# Patient Record
Sex: Female | Born: 1970 | Race: Black or African American | Hispanic: No | Smoking: Never smoker
Health system: Southern US, Community
[De-identification: ages and names within clinical notes are randomized; demographics above are authoritative.]

## PROBLEM LIST (undated history)

## (undated) DIAGNOSIS — I10 Essential (primary) hypertension: Secondary | ICD-10-CM

## (undated) HISTORY — PX: WISDOM TOOTH EXTRACTION: SHX21

## (undated) HISTORY — PX: TUBAL LIGATION: SHX77

---

## 1999-03-26 ENCOUNTER — Emergency Department (HOSPITAL_COMMUNITY): Admission: EM | Admit: 1999-03-26 | Discharge: 1999-03-26 | Payer: Self-pay

## 1999-03-26 ENCOUNTER — Encounter: Payer: Self-pay | Admitting: Emergency Medicine

## 1999-04-14 ENCOUNTER — Emergency Department (HOSPITAL_COMMUNITY): Admission: EM | Admit: 1999-04-14 | Discharge: 1999-04-14 | Payer: Self-pay | Admitting: *Deleted

## 1999-08-19 ENCOUNTER — Emergency Department (HOSPITAL_COMMUNITY): Admission: EM | Admit: 1999-08-19 | Discharge: 1999-08-19 | Payer: Self-pay | Admitting: Emergency Medicine

## 1999-10-22 ENCOUNTER — Emergency Department (HOSPITAL_COMMUNITY): Admission: EM | Admit: 1999-10-22 | Discharge: 1999-10-22 | Payer: Self-pay | Admitting: Emergency Medicine

## 2000-03-17 ENCOUNTER — Emergency Department (HOSPITAL_COMMUNITY): Admission: EM | Admit: 2000-03-17 | Discharge: 2000-03-17 | Payer: Self-pay | Admitting: Emergency Medicine

## 2000-03-18 ENCOUNTER — Emergency Department (HOSPITAL_COMMUNITY): Admission: EM | Admit: 2000-03-18 | Discharge: 2000-03-18 | Payer: Self-pay

## 2000-07-19 ENCOUNTER — Emergency Department (HOSPITAL_COMMUNITY): Admission: EM | Admit: 2000-07-19 | Discharge: 2000-07-19 | Payer: Self-pay | Admitting: Emergency Medicine

## 2001-02-24 ENCOUNTER — Emergency Department (HOSPITAL_COMMUNITY): Admission: EM | Admit: 2001-02-24 | Discharge: 2001-02-24 | Payer: Self-pay | Admitting: Emergency Medicine

## 2001-02-24 ENCOUNTER — Encounter: Payer: Self-pay | Admitting: Emergency Medicine

## 2001-02-25 ENCOUNTER — Inpatient Hospital Stay (HOSPITAL_COMMUNITY): Admission: AD | Admit: 2001-02-25 | Discharge: 2001-02-25 | Payer: Self-pay | Admitting: Obstetrics

## 2001-05-24 ENCOUNTER — Emergency Department (HOSPITAL_COMMUNITY): Admission: EM | Admit: 2001-05-24 | Discharge: 2001-05-24 | Payer: Self-pay | Admitting: *Deleted

## 2002-01-13 ENCOUNTER — Emergency Department (HOSPITAL_COMMUNITY): Admission: EM | Admit: 2002-01-13 | Discharge: 2002-01-13 | Payer: Self-pay | Admitting: Emergency Medicine

## 2003-02-25 ENCOUNTER — Emergency Department (HOSPITAL_COMMUNITY): Admission: EM | Admit: 2003-02-25 | Discharge: 2003-02-25 | Payer: Self-pay | Admitting: Emergency Medicine

## 2003-02-27 ENCOUNTER — Ambulatory Visit (HOSPITAL_COMMUNITY): Admission: RE | Admit: 2003-02-27 | Discharge: 2003-02-27 | Payer: Self-pay

## 2004-04-07 ENCOUNTER — Emergency Department (HOSPITAL_COMMUNITY): Admission: EM | Admit: 2004-04-07 | Discharge: 2004-04-07 | Payer: Self-pay | Admitting: Emergency Medicine

## 2005-10-21 ENCOUNTER — Emergency Department (HOSPITAL_COMMUNITY): Admission: EM | Admit: 2005-10-21 | Discharge: 2005-10-21 | Payer: Self-pay | Admitting: Emergency Medicine

## 2006-02-28 ENCOUNTER — Emergency Department (HOSPITAL_COMMUNITY): Admission: EM | Admit: 2006-02-28 | Discharge: 2006-02-28 | Payer: Self-pay | Admitting: Emergency Medicine

## 2007-07-11 ENCOUNTER — Emergency Department (HOSPITAL_COMMUNITY): Admission: EM | Admit: 2007-07-11 | Discharge: 2007-07-11 | Payer: Self-pay | Admitting: Emergency Medicine

## 2009-05-19 ENCOUNTER — Emergency Department (HOSPITAL_COMMUNITY): Admission: EM | Admit: 2009-05-19 | Discharge: 2009-05-19 | Payer: Self-pay | Admitting: Emergency Medicine

## 2009-10-03 ENCOUNTER — Emergency Department (HOSPITAL_COMMUNITY): Admission: EM | Admit: 2009-10-03 | Discharge: 2009-10-03 | Payer: Self-pay | Admitting: Emergency Medicine

## 2010-05-16 ENCOUNTER — Emergency Department (HOSPITAL_COMMUNITY)
Admission: EM | Admit: 2010-05-16 | Discharge: 2010-05-16 | Payer: Self-pay | Source: Home / Self Care | Admitting: Family Medicine

## 2010-05-22 ENCOUNTER — Emergency Department (HOSPITAL_COMMUNITY)
Admission: EM | Admit: 2010-05-22 | Discharge: 2010-05-22 | Payer: Self-pay | Source: Home / Self Care | Admitting: Emergency Medicine

## 2010-08-16 LAB — URINALYSIS, ROUTINE W REFLEX MICROSCOPIC
Bilirubin Urine: NEGATIVE
Ketones, ur: NEGATIVE mg/dL
Nitrite: NEGATIVE
Protein, ur: NEGATIVE mg/dL
Urobilinogen, UA: 1 mg/dL (ref 0.0–1.0)

## 2010-08-16 LAB — POCT I-STAT, CHEM 8
BUN: 11 mg/dL (ref 6–23)
Creatinine, Ser: 0.9 mg/dL (ref 0.4–1.2)
Potassium: 3.8 mEq/L (ref 3.5–5.1)
Sodium: 140 mEq/L (ref 135–145)

## 2010-09-07 LAB — DIFFERENTIAL
Basophils Absolute: 0 10*3/uL (ref 0.0–0.1)
Eosinophils Relative: 1 % (ref 0–5)
Lymphocytes Relative: 53 % — ABNORMAL HIGH (ref 12–46)
Lymphs Abs: 3.1 10*3/uL (ref 0.7–4.0)
Neutro Abs: 2.3 10*3/uL (ref 1.7–7.7)
Neutrophils Relative %: 40 % — ABNORMAL LOW (ref 43–77)

## 2010-09-07 LAB — COMPREHENSIVE METABOLIC PANEL
ALT: 25 U/L (ref 0–35)
AST: 25 U/L (ref 0–37)
Albumin: 4 g/dL (ref 3.5–5.2)
CO2: 28 mEq/L (ref 19–32)
Calcium: 9.1 mg/dL (ref 8.4–10.5)
Chloride: 102 mEq/L (ref 96–112)
GFR calc Af Amer: >60 mL/min (ref >60)
GFR calc non Af Amer: >60 mL/min (ref >60)
Sodium: 137 mEq/L (ref 135–145)

## 2010-09-07 LAB — POCT CARDIAC MARKERS
CKMB, poc: 1.1 ng/mL (ref 1.0–8.0)
CKMB, poc: 1.3 ng/mL (ref 1.0–8.0)
Myoglobin, poc: 90.2 ng/mL (ref 12–200)
Troponin i, poc: <0.05 ng/mL (ref 0.00–0.09)
Troponin i, poc: <0.05 ng/mL (ref 0.00–0.09)

## 2010-09-07 LAB — CBC
HCT: 38.6 % (ref 36.0–46.0)
Platelets: 268 10*3/uL (ref 150–400)
WBC: 5.9 10*3/uL (ref 4.0–10.5)

## 2011-02-25 LAB — DIFFERENTIAL
Eosinophils Absolute: 0
Lymphs Abs: 2.6
Monocytes Relative: 5
Neutro Abs: 5.5
Neutrophils Relative %: 64

## 2011-02-25 LAB — POCT CARDIAC MARKERS
CKMB, poc: 1.2
Myoglobin, poc: 109
Operator id: 4001

## 2011-02-25 LAB — BASIC METABOLIC PANEL
BUN: 7
Calcium: 9.4
Creatinine, Ser: 0.91
GFR calc Af Amer: >60
GFR calc non Af Amer: >60

## 2011-02-25 LAB — CBC
Platelets: 308
RBC: 4.4
WBC: 8.6

## 2011-11-17 ENCOUNTER — Emergency Department (HOSPITAL_COMMUNITY): Payer: Self-pay

## 2011-11-17 ENCOUNTER — Encounter (HOSPITAL_COMMUNITY): Payer: Self-pay | Admitting: Emergency Medicine

## 2011-11-17 ENCOUNTER — Emergency Department (HOSPITAL_COMMUNITY)
Admission: EM | Admit: 2011-11-17 | Discharge: 2011-11-17 | Disposition: A | Discharge status: Home / Self Care | Payer: Self-pay | Attending: Emergency Medicine | Admitting: Emergency Medicine

## 2011-11-17 DIAGNOSIS — R42 Dizziness and giddiness: Secondary | ICD-10-CM | POA: Insufficient documentation

## 2011-11-17 DIAGNOSIS — H539 Unspecified visual disturbance: Secondary | ICD-10-CM | POA: Insufficient documentation

## 2011-11-17 DIAGNOSIS — M542 Cervicalgia: Secondary | ICD-10-CM | POA: Insufficient documentation

## 2011-11-17 DIAGNOSIS — R079 Chest pain, unspecified: Secondary | ICD-10-CM | POA: Insufficient documentation

## 2011-11-17 DIAGNOSIS — I160 Hypertensive urgency: Secondary | ICD-10-CM

## 2011-11-17 DIAGNOSIS — I10 Essential (primary) hypertension: Secondary | ICD-10-CM | POA: Insufficient documentation

## 2011-11-17 HISTORY — DX: Essential (primary) hypertension: I10

## 2011-11-17 LAB — BASIC METABOLIC PANEL
Calcium: 9.3 mg/dL (ref 8.4–10.5)
GFR calc Af Amer: 89 mL/min — ABNORMAL LOW (ref >90)
GFR calc non Af Amer: 77 mL/min — ABNORMAL LOW (ref >90)
Glucose, Bld: 89 mg/dL (ref 70–99)
Potassium: 3.7 mEq/L (ref 3.5–5.1)
Sodium: 138 mEq/L (ref 135–145)

## 2011-11-17 LAB — CBC
MCH: 29.7 pg (ref 26.0–34.0)
MCV: 87.1 fL (ref 78.0–100.0)
Platelets: 304 10*3/uL (ref 150–400)
RBC: 4.51 MIL/uL (ref 3.87–5.11)
RDW: 12.9 % (ref 11.5–15.5)
WBC: 5.9 10*3/uL (ref 4.0–10.5)

## 2011-11-17 LAB — HEPATIC FUNCTION PANEL
AST: 21 U/L (ref 0–37)
Albumin: 3.9 g/dL (ref 3.5–5.2)
Alkaline Phosphatase: 65 U/L (ref 39–117)
Total Bilirubin: 0.5 mg/dL (ref 0.3–1.2)

## 2011-11-17 LAB — DIFFERENTIAL
Basophils Absolute: 0 10*3/uL (ref 0.0–0.1)
Eosinophils Absolute: 0.1 10*3/uL (ref 0.0–0.7)
Eosinophils Relative: 1 % (ref 0–5)
Lymphs Abs: 2.6 10*3/uL (ref 0.7–4.0)
Neutrophils Relative %: 51 % (ref 43–77)

## 2011-11-17 MED ORDER — METOPROLOL TARTRATE 50 MG PO TABS: 50.0000 mg | ORAL_TABLET | Freq: Two times a day (BID) | ORAL | Status: DC

## 2011-11-17 MED ORDER — HYDROCHLOROTHIAZIDE 25 MG PO TABS: 25.0000 mg | ORAL_TABLET | Freq: Every day | ORAL | Status: DC

## 2011-11-17 MED ORDER — PROMETHAZINE HCL 25 MG PO TABS: 25.0000 mg | ORAL_TABLET | Freq: Four times a day (QID) | ORAL | Status: DC | PRN

## 2011-11-17 MED ORDER — SODIUM CHLORIDE 0.9 % IV SOLN
INTRAVENOUS | Status: DC
  Administered 2011-11-17: 16:00:00 via INTRAVENOUS

## 2011-11-17 MED ORDER — HYDROCODONE-ACETAMINOPHEN 5-325 MG PO TABS: 1.0000 | ORAL_TABLET | ORAL | Status: AC | PRN

## 2011-11-17 MED ORDER — SODIUM CHLORIDE 0.9 % IV SOLN: INTRAVENOUS | Status: DC

## 2011-11-17 MED ORDER — LABETALOL HCL 5 MG/ML IV SOLN
20.0000 mg | Freq: Once | INTRAVENOUS | Status: AC
  Administered 2011-11-17: 20 mg via INTRAVENOUS
  Filled 2011-11-17: qty 4

## 2011-11-17 MED ORDER — SODIUM CHLORIDE 0.9 % IV SOLN: Freq: Once | INTRAVENOUS | Status: DC

## 2011-11-17 NOTE — Discharge Instructions (Signed)
Hypertension       Hypertension is another name for high blood pressure. High blood pressure may mean that your heart needs to work harder to pump blood. Blood pressure consists of two numbers, which includes a higher number over a lower number (example: 110/72).     HOME CARE     Make lifestyle changes as told by your doctor. This may include weight loss and exercise.   Take your blood pressure medicine every day.   Limit how much salt you use.   Stop smoking if you smoke.   Do not use drugs.   Talk to your doctor if you are using decongestants or birth control pills. These medicines might make blood pressure higher.   Females should not drink more than 1 alcoholic drink per day. Males should not drink more than 2 alcoholic drinks per day.   See your doctor as told.    GET HELP RIGHT AWAY IF:     You have a blood pressure reading with a top number of 180 or higher.   You get a very bad headache.   You get blurred or changing vision.   You feel confused.   You feel weak, numb, or faint.   You get chest or belly (abdominal) pain.   You throw up (vomit).   You cannot breathe very well.    MAKE SURE YOU:     Understand these instructions.   Will watch your condition.   Will get help right away if you are not doing well or get worse.      Document Released: 11/09/2007 Document Revised: 05/12/2011 Document Reviewed: 11/09/2007   [employer] Patient Information 2012 [employer].

## 2011-11-17 NOTE — ED Notes (Signed)
Pt in xray

## 2011-11-17 NOTE — ED Provider Notes (Signed)
History     CSN: [id number]  Arrival date & time 11/17/11  1257   First MD Initiated Contact with Patient 11/17/11 1434      Chief Complaint  Patient presents with  . Chest Pain  . Dizziness    (Consider location/radiation/quality/duration/timing/severity/associated sxs/prior treatment) HPI Comments: The patient is a 41 year old woman who says that her blood pressure is high and she has chest pain. She also notes a feeling of swelling in the right side of her face. She saw a gray specks in front of her vision. This started this morning with a headache. She had gone to work to follow work felt worse and therefore sought evaluation. She has known hypertension, but has been off her medications for over a month.  Patient is a 41 y.o. female presenting with chest pain. The history is provided by the patient and medical records. No language interpreter was used.  Chest Pain The chest pain began 3 - 5 hours ago. Episode Length: She has brief episodes of chest pain in the anterior chest. Chest pain occurs intermittently. The chest pain is unchanged. Associated with: Nothing. At its most intense, the pain is at 8/10. The severity of the pain is moderate. The quality of the pain is described as sharp. The pain does not radiate. Primary symptoms comment: Headache, seeing spots. She tried nothing for the symptoms. Risk factors include obesity (Known hypertension.).  Her past medical history is significant for hypertension.     Past Medical History  Diagnosis Date  . Hypertension     Past Surgical History  Procedure Date  . Tubal ligation     No family history on file.  History  Substance Use Topics  . Smoking status: Never Smoker   . Smokeless tobacco: Not on file  . Alcohol Use: No    OB History    Grav Para Term Preterm Abortions TAB SAB Ect Mult Living                  Review of Systems  Constitutional: Negative.   HENT: Positive for neck stiffness.   Eyes: Positive for  visual disturbance.  Respiratory: Negative.   Cardiovascular: Positive for chest pain.  Gastrointestinal: Negative.   Genitourinary: Negative.   Skin: Negative.   Neurological: Positive for headaches.  Psychiatric/Behavioral: Negative.     Allergies  Review of patient's allergies indicates no known allergies.  Home Medications   Current Outpatient Rx  Name Route Sig Dispense Refill  . CETIRIZINE HCL 10 MG PO TABS Oral Take 10 mg by mouth daily as needed. For allergies      BP 99/45  Pulse 58  Temp 98.6 F (37 C) (Oral)  Resp 18  SpO2 97%  LMP 11/13/2011  Physical Exam  Nursing note and vitals reviewed. Constitutional: She is oriented to person, place, and time. She appears well-developed and well-nourished. No distress.       BP 147/107   HENT:  Head: Normocephalic and atraumatic.  Right Ear: External ear normal.  Left Ear: External ear normal.  Mouth/Throat: Oropharynx is clear and moist.  Eyes: Conjunctivae and EOM are normal. Pupils are equal, round, and reactive to light.  Neck: Normal range of motion.  Cardiovascular: Normal rate, regular rhythm and normal heart sounds.   Pulmonary/Chest: Effort normal and breath sounds normal.  Abdominal: Bowel sounds are normal.  Musculoskeletal: Normal range of motion.       Trace edema in both ankles.  Neurological: She is alert and oriented  to person, place, and time.       No sensory or motor deficit.  Skin: Skin is warm and dry.  Psychiatric: She has a normal mood and affect. Her behavior is normal.    ED Course  Procedures (including critical care time)  Labs Reviewed  BASIC METABOLIC PANEL - Abnormal; Notable for the following:    GFR calc non Af Amer 77 (*)     GFR calc Af Amer 89 (*)     All other components within normal limits  CBC  DIFFERENTIAL  POCT I-STAT TROPONIN I  URINALYSIS, ROUTINE W REFLEX MICROSCOPIC  HEPATIC FUNCTION PANEL   Dg Chest 2 View  11/17/2011  *RADIOLOGY REPORT*  Clinical Data:  Chest pain and dizziness.  CHEST - 2 VIEW  Comparison: No priors.  Findings: Lung volumes are normal.  No consolidative airspace disease.  No pleural effusions.  No pneumothorax.  No pulmonary nodule or mass noted.  Pulmonary vasculature and the cardiomediastinal silhouette are within normal limits.  IMPRESSION: 1. No radiographic evidence of acute cardiopulmonary disease.  Original Report Authenticated By: Florencia Reasons, M.D.   Ct Head Wo Contrast  11/17/2011  *RADIOLOGY REPORT*  Clinical Data: Chest pain, headache, hypertension  CT HEAD WITHOUT CONTRAST  Technique:  Contiguous axial images were obtained from the base of the skull through the vertex without contrast.  Comparison: 05/19/2009  Findings: No skull fracture is noted.  Paranasal sinuses and mastoid air cells are unremarkable.  No intracranial hemorrhage, mass effect or midline shift.  No acute infarction.  No mass lesion is noted on this unenhanced scan.  No intra or extra-axial fluid collection.  Ventricular size is stable from prior exam.  IMPRESSION: No acute intracranial abnormality.  No significant change.  Original Report Authenticated By: Natasha Mead, M.D.   Pt was seen and had physical examination.  IV fluids and IV labetalol were ordered for her hypertensive urgency.  Lab workup was ordered.  5:02 PM Results for orders placed during the hospital encounter of 11/17/11  CBC      Component Value Range   WBC 5.9  4.0 - 10.5 K/uL   RBC 4.51  3.87 - 5.11 MIL/uL   Hemoglobin 13.4  12.0 - 15.0 g/dL   HCT 16.1  09.6 - 04.5 %   MCV 87.1  78.0 - 100.0 fL   MCH 29.7  26.0 - 34.0 pg   MCHC 34.1  30.0 - 36.0 g/dL   RDW 40.9  81.1 - 91.4 %   Platelets 304  150 - 400 K/uL  DIFFERENTIAL      Component Value Range   Neutrophils Relative 51  43 - 77 %   Neutro Abs 3.0  1.7 - 7.7 K/uL   Lymphocytes Relative 44  12 - 46 %   Lymphs Abs 2.6  0.7 - 4.0 K/uL   Monocytes Relative 4  3 - 12 %   Monocytes Absolute 0.2  0.1 - 1.0 K/uL    Eosinophils Relative 1  0 - 5 %   Eosinophils Absolute 0.1  0.0 - 0.7 K/uL   Basophils Relative 0  0 - 1 %   Basophils Absolute 0.0  0.0 - 0.1 K/uL  BASIC METABOLIC PANEL      Component Value Range   Sodium 138  135 - 145 mEq/L   Potassium 3.7  3.5 - 5.1 mEq/L   Chloride 102  96 - 112 mEq/L   CO2 24  19 - 32 mEq/L  Glucose, Bld 89  70 - 99 mg/dL   BUN 11  6 - 23 mg/dL   Creatinine, Ser 8.29  0.50 - 1.10 mg/dL   Calcium 9.3  8.4 - 56.2 mg/dL   GFR calc non Af Amer 77 (*) >90 mL/min   GFR calc Af Amer 89 (*) >90 mL/min  POCT I-STAT TROPONIN I      Component Value Range   Troponin i, poc 0.00  0.00 - 0.08 ng/mL   Comment 3           HEPATIC FUNCTION PANEL      Component Value Range   Total Protein 7.6  6.0 - 8.3 g/dL   Albumin 3.9  3.5 - 5.2 g/dL   AST 21  0 - 37 U/L   ALT 17  0 - 35 U/L   Alkaline Phosphatase 65  39 - 117 U/L   Total Bilirubin 0.5  0.3 - 1.2 mg/dL   Bilirubin, Direct PENDING  0.0 - 0.3 mg/dL   Indirect Bilirubin PENDING  0.3 - 0.9 mg/dL   Dg Chest 2 View  07/06/8655  *RADIOLOGY REPORT*  Clinical Data: Chest pain and dizziness.  CHEST - 2 VIEW  Comparison: No priors.  Findings: Lung volumes are normal.  No consolidative airspace disease.  No pleural effusions.  No pneumothorax.  No pulmonary nodule or mass noted.  Pulmonary vasculature and the cardiomediastinal silhouette are within normal limits.  IMPRESSION: 1. No radiographic evidence of acute cardiopulmonary disease.  Original Report Authenticated By: Florencia Reasons, M.D.   Ct Head Wo Contrast  11/17/2011  *RADIOLOGY REPORT*  Clinical Data: Chest pain, headache, hypertension  CT HEAD WITHOUT CONTRAST  Technique:  Contiguous axial images were obtained from the base of the skull through the vertex without contrast.  Comparison: 05/19/2009  Findings: No skull fracture is noted.  Paranasal sinuses and mastoid air cells are unremarkable.  No intracranial hemorrhage, mass effect or midline shift.  No acute  infarction.  No mass lesion is noted on this unenhanced scan.  No intra or extra-axial fluid collection.  Ventricular size is stable from prior exam.  IMPRESSION: No acute intracranial abnormality.  No significant change.  Original Report Authenticated By: Natasha Mead, M.D.    Lab workup was negative.  Pt's BP is down to an acceptable range.  Rx HCTZ, metoprolol.  F/U with [facility].  Strongly urged pt to take BP medications every day without fail, to avoid the complications of hypertension which are stroke, heart attack, and kidney failure.  1. Hypertensive urgency           Carleene Cooper III, MD 11/17/11 1704

## 2011-11-17 NOTE — ED Notes (Signed)
Dr. Davidson at bedside.

## 2011-11-17 NOTE — ED Notes (Addendum)
Pt reports while at work today had midsternal chest pain with shortness of breath and nausea. Pt also reports pain to right side of head and jaw. Pt reports that she has not been taking her BP meds as directed.

## 2012-08-01 ENCOUNTER — Encounter (HOSPITAL_COMMUNITY): Payer: Self-pay | Admitting: *Deleted

## 2012-08-01 ENCOUNTER — Emergency Department (HOSPITAL_COMMUNITY): Payer: BC Managed Care – PPO

## 2012-08-01 ENCOUNTER — Inpatient Hospital Stay (HOSPITAL_COMMUNITY)
Admission: EM | Admit: 2012-08-01 | Discharge: 2012-08-05 | DRG: 813 | Disposition: A | Discharge status: Home / Self Care | Payer: BC Managed Care – PPO | Attending: Internal Medicine | Admitting: Internal Medicine

## 2012-08-01 DIAGNOSIS — R1013 Epigastric pain: Secondary | ICD-10-CM | POA: Diagnosis present

## 2012-08-01 DIAGNOSIS — E869 Volume depletion, unspecified: Secondary | ICD-10-CM | POA: Diagnosis present

## 2012-08-01 DIAGNOSIS — E876 Hypokalemia: Secondary | ICD-10-CM | POA: Diagnosis present

## 2012-08-01 DIAGNOSIS — K5289 Other specified noninfective gastroenteritis and colitis: Principal | ICD-10-CM | POA: Diagnosis present

## 2012-08-01 DIAGNOSIS — I10 Essential (primary) hypertension: Secondary | ICD-10-CM | POA: Diagnosis present

## 2012-08-01 DIAGNOSIS — Z23 Encounter for immunization: Secondary | ICD-10-CM

## 2012-08-01 DIAGNOSIS — R112 Nausea with vomiting, unspecified: Secondary | ICD-10-CM | POA: Diagnosis present

## 2012-08-01 DIAGNOSIS — R1115 Cyclical vomiting syndrome unrelated to migraine: Secondary | ICD-10-CM

## 2012-08-01 LAB — CBC WITH DIFFERENTIAL/PLATELET
Basophils Absolute: 0 10*3/uL (ref 0.0–0.1)
Eosinophils Absolute: 0 10*3/uL (ref 0.0–0.7)
Eosinophils Relative: 0 % (ref 0–5)
HCT: 41.6 % (ref 36.0–46.0)
Lymphs Abs: 1.8 10*3/uL (ref 0.7–4.0)
MCH: 30.2 pg (ref 26.0–34.0)
MCV: 86.7 fL (ref 78.0–100.0)
Monocytes Absolute: 0.2 10*3/uL (ref 0.1–1.0)
Platelets: 285 10*3/uL (ref 150–400)
RDW: 12.8 % (ref 11.5–15.5)

## 2012-08-01 LAB — COMPREHENSIVE METABOLIC PANEL
ALT: 19 U/L (ref 0–35)
Calcium: 9.6 mg/dL (ref 8.4–10.5)
Creatinine, Ser: 0.75 mg/dL (ref 0.50–1.10)
GFR calc Af Amer: >90 mL/min (ref >90)
GFR calc non Af Amer: >90 mL/min (ref >90)
Glucose, Bld: 98 mg/dL (ref 70–99)
Sodium: 136 mEq/L (ref 135–145)
Total Protein: 8.5 g/dL — ABNORMAL HIGH (ref 6.0–8.3)

## 2012-08-01 LAB — POCT I-STAT TROPONIN I
Troponin i, poc: 0.02 ng/mL (ref 0.00–0.08)
Troponin i, poc: 0 ng/mL (ref 0.00–0.08)

## 2012-08-01 LAB — URINALYSIS, ROUTINE W REFLEX MICROSCOPIC
Bilirubin Urine: NEGATIVE
Glucose, UA: NEGATIVE mg/dL
Hgb urine dipstick: NEGATIVE
Ketones, ur: 40 mg/dL — AB
Protein, ur: NEGATIVE mg/dL
Urobilinogen, UA: 0.2 mg/dL (ref 0.0–1.0)

## 2012-08-01 MED ORDER — SODIUM CHLORIDE 0.9 % IV BOLUS (SEPSIS)
1000.0000 mL | Freq: Once | INTRAVENOUS | Status: AC
  Administered 2012-08-01: 1000 mL via INTRAVENOUS

## 2012-08-01 MED ORDER — IOHEXOL 300 MG/ML  SOLN
25.0000 mL | INTRAMUSCULAR | Status: AC
  Administered 2012-08-01 (×2): 25 mL via ORAL

## 2012-08-01 MED ORDER — HYDROMORPHONE HCL PF 1 MG/ML IJ SOLN
1.0000 mg | Freq: Once | INTRAMUSCULAR | Status: DC
  Filled 2012-08-01: qty 1

## 2012-08-01 MED ORDER — MORPHINE SULFATE 4 MG/ML IJ SOLN
4.0000 mg | Freq: Once | INTRAMUSCULAR | Status: AC
  Administered 2012-08-01: 4 mg via INTRAVENOUS
  Filled 2012-08-01: qty 1

## 2012-08-01 MED ORDER — METOCLOPRAMIDE HCL 5 MG/ML IJ SOLN
10.0000 mg | Freq: Once | INTRAMUSCULAR | Status: AC
  Administered 2012-08-01: 10 mg via INTRAVENOUS
  Filled 2012-08-01: qty 2

## 2012-08-01 MED ORDER — ONDANSETRON HCL 4 MG/2ML IJ SOLN
4.0000 mg | Freq: Once | INTRAMUSCULAR | Status: AC
  Administered 2012-08-01: 4 mg via INTRAVENOUS
  Filled 2012-08-01: qty 2

## 2012-08-01 MED ORDER — SODIUM CHLORIDE 0.9 % IV BOLUS (SEPSIS)
500.0000 mL | Freq: Once | INTRAVENOUS | Status: AC
  Administered 2012-08-01: 500 mL via INTRAVENOUS

## 2012-08-01 MED ORDER — ASPIRIN 81 MG PO CHEW
324.0000 mg | CHEWABLE_TABLET | Freq: Once | ORAL | Status: AC
  Administered 2012-08-01: 324 mg via ORAL
  Filled 2012-08-01: qty 4

## 2012-08-01 MED ORDER — IOHEXOL 300 MG/ML  SOLN
100.0000 mL | Freq: Once | INTRAMUSCULAR | Status: AC | PRN
  Administered 2012-08-01: 100 mL via INTRAVENOUS

## 2012-08-01 NOTE — ED Provider Notes (Signed)
History     CSN: [id number]  Arrival date & time 08/01/12  1557   First MD Initiated Contact with Patient 08/01/12 1604      Chief Complaint  Patient presents with  . Emesis  . Chest Pain    (Consider location/radiation/quality/duration/timing/severity/associated sxs/prior treatment) Patient is a 42 y.o. female presenting with vomiting and chest pain. The history is provided by the patient and medical records. No language interpreter was used.  Emesis Associated symptoms: abdominal pain and headaches   Associated symptoms: no diarrhea   Chest Pain Associated symptoms: abdominal pain, headache, nausea, shortness of breath and vomiting   Associated symptoms: no back pain, no cough, no diaphoresis, no fatigue and no fever     Debbie Davidson is a 42 y.o. female  with a hx of HTN (no medications needed) presents to the Emergency Department complaining of gradual, persistent, progressively worsening vomiting and chest pain onset this AM at 11am. Associated symptoms include lightheaded, headache, CP (8/10), vomiting x5-6 since 11am, headache, chest tightness, shortness of breath.  FHx of MI in father and mother. Headache for several days that has been adequately treated with tylenol.   Nothing makes it better and nothing makes it worse.  Pt denies fever, chills, neck pain, syncope.     Past Medical History  Diagnosis Date  . Hypertension     Past Surgical History  Procedure Laterality Date  . Tubal ligation      History reviewed. No pertinent family history.  History  Substance Use Topics  . Smoking status: Never Smoker   . Smokeless tobacco: Not on file  . Alcohol Use: No    OB History   Grav Para Term Preterm Abortions TAB SAB Ect Mult Living                  Review of Systems  Constitutional: Negative for fever, diaphoresis, appetite change, fatigue and unexpected weight change.  HENT: Negative for mouth sores, neck pain and neck stiffness.   Eyes: Negative for  visual disturbance.  Respiratory: Positive for chest tightness and shortness of breath. Negative for cough and wheezing.   Cardiovascular: Positive for chest pain.  Gastrointestinal: Positive for nausea, vomiting and abdominal pain. Negative for diarrhea and constipation.  Endocrine: Negative for polydipsia, polyphagia and polyuria.  Genitourinary: Negative for dysuria, urgency, frequency and hematuria.  Musculoskeletal: Negative for back pain.  Skin: Negative for rash.  Allergic/Immunologic: Negative for immunocompromised state.  Neurological: Positive for light-headedness and headaches. Negative for syncope.  Hematological: Does not bruise/bleed easily.  Psychiatric/Behavioral: Negative for sleep disturbance. The patient is not nervous/anxious.   All other systems reviewed and are negative.    Allergies  Review of patient's allergies indicates no known allergies.  Home Medications  No current outpatient prescriptions on file.  BP 93/56  Pulse 75  Temp(Src) 98 F (36.7 C) (Oral)  Resp 15  SpO2 94%  LMP 07/25/2012  Physical Exam  Nursing note and vitals reviewed. Constitutional: She is oriented to person, place, and time. She appears well-developed and well-nourished.  HENT:  Head: Normocephalic and atraumatic.  Right Ear: Tympanic membrane, external ear and ear canal normal.  Left Ear: Tympanic membrane, external ear and ear canal normal.  Nose: Nose normal. No mucosal edema or rhinorrhea.  Mouth/Throat: Uvula is midline, oropharynx is clear and moist and mucous membranes are normal. Mucous membranes are not dry and not cyanotic. No oropharyngeal exudate, posterior oropharyngeal edema, posterior oropharyngeal erythema or tonsillar abscesses.  Eyes:  Conjunctivae and EOM are normal. Pupils are equal, round, and reactive to light. No scleral icterus.  Neck: Normal range of motion.  Cardiovascular: Normal rate, regular rhythm, normal heart sounds and intact distal pulses.  Exam  reveals no gallop and no friction rub.   No murmur heard. Pulmonary/Chest: Effort normal and breath sounds normal. No respiratory distress. She has no wheezes. She has no rales. She exhibits no tenderness.  Abdominal: Soft. Bowel sounds are normal. She exhibits no distension and no mass. There is no hepatosplenomegaly. There is tenderness in the epigastric area and left upper quadrant. There is guarding. There is no rebound and no CVA tenderness.  Obese abdomen  Musculoskeletal: Normal range of motion. She exhibits no edema and no tenderness.  Lymphadenopathy:    She has no cervical adenopathy.  Neurological: She is alert and oriented to person, place, and time. No cranial nerve deficit. She exhibits normal muscle tone. Coordination normal.  Skin: Skin is warm and dry. No rash noted. No erythema.  Psychiatric: She has a normal mood and affect.    ED Course  Procedures (including critical care time)  Labs Reviewed  CBC WITH DIFFERENTIAL - Abnormal; Notable for the following:    Neutrophils Relative 78 (*)    Monocytes Relative 2 (*)    All other components within normal limits  COMPREHENSIVE METABOLIC PANEL - Abnormal; Notable for the following:    Total Protein 8.5 (*)    All other components within normal limits  URINALYSIS, ROUTINE W REFLEX MICROSCOPIC - Abnormal; Notable for the following:    Ketones, ur 40 (*)    All other components within normal limits  LIPASE, BLOOD  POCT I-STAT TROPONIN I  POCT PREGNANCY, URINE  POCT I-STAT TROPONIN I   Ct Abdomen Pelvis W Contrast  08/02/2012  *RADIOLOGY REPORT*  Clinical Data: Nausea and vomiting.  Diffuse abdominal pain for 1 day.  CT ABDOMEN AND PELVIS WITH CONTRAST  Technique:  Multidetector CT imaging of the abdomen and pelvis was performed following the standard protocol during bolus administration of intravenous contrast.  Contrast: OMNIPAQUE IOHEXOL 300 MG/ML  SOLN  Comparison: None.  Findings: Atelectasis or infiltration in  both lung bases.  The liver, spleen, gallbladder, pancreas, adrenal glands, abdominal aorta, retroperitoneal lymph nodes, and kidneys are unremarkable. There appears to be a small accessory spleen.  The stomach and small bowel are decompressed.  Colon is decompressed.  No free air or free fluid in the abdomen.  Pelvis:  The uterus and ovaries are not enlarged.  The bladder wall is not thickened.  No free or loculated pelvic fluid collections. The appendix is normal.  No diverticulitis.  Normal alignment of the lumbar vertebrae.  IMPRESSION: No acute process demonstrated in the abdomen or pelvis.   Original Report Authenticated By: Burman Nieves, M.D.    Dg Abd Acute W/chest  08/01/2012  *RADIOLOGY REPORT*  Clinical Data: Chest pain, shortness of breath, upper abdominal pain, nausea, vomiting, history hypertension  ACUTE ABDOMEN SERIES (ABDOMEN 2 VIEW & CHEST 1 VIEW)  Comparison: Chest radiographs 11/17/2011  Findings: Upper-normal size of cardiac silhouette. Mediastinal contours and pulmonary vascularity normal. Lungs clear. No pleural effusion or pneumothorax. Normal bowel gas pattern. No bowel dilatation, bowel wall thickening or free intraperitoneal air. Mild broad-based levoconvex thoracolumbar scoliosis. No urinary tract calcification.  IMPRESSION: No acute abnormalities.   Original Report Authenticated By: Ulyses Southward, M.D.    ECG:  Date: 08/01/2012  Rate: 61  Rhythm: normal sinus rhythm  QRS  Axis: normal  Intervals: PR prolonged  ST/T Wave abnormalities: nonspecific ST changes  Conduction Disutrbances:first-degree A-V block   Narrative Interpretation: t wave flattening V3-V4, no old for comparison  Old EKG Reviewed: none available    1. Emesis, persistent       MDM  Debbie Davidson presents with chest pain, nausea and vomiting.  Troponin and delta troponin negative. Urinalysis with small amount of ketones, (negative, CMP unremarkable, lipase within normal limits.  CBC without  leukocytosis. Acute abdomen with upright unremarkable and CT scan negative.  She continues to have pain and intractable vomiting; unable to tolerate even sips of PO. Alert, nontoxic, nonseptic appearing. We'll pursue admission.          Debbie Client Gavriella Hearst, PA-C 08/02/12 0101

## 2012-08-01 NOTE — ED Notes (Signed)
Pt arrived by [facility] for chest wall pain and n/v x 1 hour. Received 18g left hand and zofran 4mg  ivp pta.

## 2012-08-01 NOTE — ED Notes (Signed)
Patient says she has been having chest pain, SOB and N/V since 1100 today. She was sent home from work and she tried "home remedies" that didn't work.  Patient said she decided to call EMS to bring her to [facility] ED.

## 2012-08-02 DIAGNOSIS — I10 Essential (primary) hypertension: Secondary | ICD-10-CM

## 2012-08-02 DIAGNOSIS — R112 Nausea with vomiting, unspecified: Secondary | ICD-10-CM | POA: Diagnosis present

## 2012-08-02 DIAGNOSIS — R1115 Cyclical vomiting syndrome unrelated to migraine: Secondary | ICD-10-CM

## 2012-08-02 DIAGNOSIS — R1013 Epigastric pain: Secondary | ICD-10-CM

## 2012-08-02 DIAGNOSIS — R52 Pain, unspecified: Secondary | ICD-10-CM

## 2012-08-02 LAB — BASIC METABOLIC PANEL
BUN: 10 mg/dL (ref 6–23)
CO2: 26 mEq/L (ref 19–32)
Calcium: 8.7 mg/dL (ref 8.4–10.5)
Chloride: 101 mEq/L (ref 96–112)
Creatinine, Ser: 0.8 mg/dL (ref 0.50–1.10)
Glucose, Bld: 103 mg/dL — ABNORMAL HIGH (ref 70–99)

## 2012-08-02 LAB — CBC
HCT: 35.7 % — ABNORMAL LOW (ref 36.0–46.0)
MCH: 29.9 pg (ref 26.0–34.0)
MCHC: 34.5 g/dL (ref 30.0–36.0)
MCV: 86.7 fL (ref 78.0–100.0)
RDW: 13 % (ref 11.5–15.5)

## 2012-08-02 MED ORDER — HYDROMORPHONE HCL PF 1 MG/ML IJ SOLN: 1.0000 mg | INTRAMUSCULAR | Status: DC | PRN

## 2012-08-02 MED ORDER — ONDANSETRON HCL 4 MG/2ML IJ SOLN
4.0000 mg | Freq: Four times a day (QID) | INTRAMUSCULAR | Status: DC | PRN
  Administered 2012-08-02 – 2012-08-04 (×5): 4 mg via INTRAVENOUS
  Filled 2012-08-02 (×5): qty 2

## 2012-08-02 MED ORDER — ACETAMINOPHEN 325 MG PO TABS
650.0000 mg | ORAL_TABLET | ORAL | Status: DC | PRN
  Administered 2012-08-02 – 2012-08-05 (×5): 650 mg via ORAL
  Filled 2012-08-02 (×5): qty 2

## 2012-08-02 MED ORDER — MORPHINE SULFATE 2 MG/ML IJ SOLN: 2.0000 mg | INTRAMUSCULAR | Status: DC | PRN

## 2012-08-02 MED ORDER — METOCLOPRAMIDE HCL 5 MG/ML IJ SOLN
5.0000 mg | Freq: Four times a day (QID) | INTRAMUSCULAR | Status: DC
  Administered 2012-08-02 – 2012-08-04 (×9): 5 mg via INTRAVENOUS
  Filled 2012-08-02 (×12): qty 1

## 2012-08-02 MED ORDER — SODIUM CHLORIDE 0.9 % IV SOLN
INTRAVENOUS | Status: DC
  Administered 2012-08-02: 1000 mL via INTRAVENOUS
  Administered 2012-08-02 – 2012-08-04 (×6): via INTRAVENOUS

## 2012-08-02 MED ORDER — HYDROMORPHONE HCL PF 1 MG/ML IJ SOLN
1.0000 mg | INTRAMUSCULAR | Status: DC | PRN
  Administered 2012-08-02: 1 mg via INTRAVENOUS
  Filled 2012-08-02: qty 1

## 2012-08-02 MED ORDER — SODIUM CHLORIDE 0.9 % IV SOLN: INTRAVENOUS | Status: AC

## 2012-08-02 MED ORDER — SODIUM CHLORIDE 0.9 % IV BOLUS (SEPSIS)
500.0000 mL | Freq: Once | INTRAVENOUS | Status: AC
  Administered 2012-08-02: 500 mL via INTRAVENOUS

## 2012-08-02 MED ORDER — PANTOPRAZOLE SODIUM 40 MG IV SOLR
40.0000 mg | INTRAVENOUS | Status: DC
  Administered 2012-08-02 – 2012-08-04 (×3): 40 mg via INTRAVENOUS
  Filled 2012-08-02 (×3): qty 40

## 2012-08-02 MED ORDER — ONDANSETRON HCL 4 MG PO TABS: 4.0000 mg | ORAL_TABLET | Freq: Four times a day (QID) | ORAL | Status: DC | PRN

## 2012-08-02 MED ORDER — ONDANSETRON HCL 4 MG/2ML IJ SOLN
4.0000 mg | Freq: Three times a day (TID) | INTRAMUSCULAR | Status: DC | PRN
  Administered 2012-08-02: 4 mg via INTRAVENOUS
  Filled 2012-08-02: qty 2

## 2012-08-02 MED ORDER — SODIUM CHLORIDE 0.9 % IV SOLN
INTRAVENOUS | Status: DC
  Administered 2012-08-02: 02:00:00 via INTRAVENOUS

## 2012-08-02 MED ORDER — ALUM & MAG HYDROXIDE-SIMETH 200-200-20 MG/5ML PO SUSP: 30.0000 mL | Freq: Four times a day (QID) | ORAL | Status: DC | PRN

## 2012-08-02 MED ORDER — POTASSIUM CHLORIDE 10 MEQ/100ML IV SOLN
10.0000 meq | INTRAVENOUS | Status: AC
  Administered 2012-08-02 (×3): 10 meq via INTRAVENOUS
  Filled 2012-08-02 (×4): qty 100

## 2012-08-02 MED ORDER — KETOROLAC TROMETHAMINE 15 MG/ML IJ SOLN
15.0000 mg | Freq: Once | INTRAMUSCULAR | Status: AC
  Administered 2012-08-02: 15 mg via INTRAVENOUS
  Filled 2012-08-02: qty 1

## 2012-08-02 NOTE — Progress Notes (Signed)
Pt asked for something for HA. Contacted night coverage <shcorr>/ had tylenol ordered q 4. Will continue to monitor pt

## 2012-08-02 NOTE — H&P (Signed)
PCP:   Ivy Lynn, MD   Chief Complaint:  n/v  HPI: 42 yo overall healthy female comes in with n/v and epigastric abd pain since 11 am.  Vomit nonbloody.  No fevers.  No diarrhea.  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  Still has gallbladder.  Symptoms better with iv dilaudid and zofran in ED but cannot hold anything down when she attempts to eat/drink.  No cp no sob.  Review of Systems:  Positive and negative as per HPI otherwise all other systems are negative  Past Medical History: Past Medical History  Diagnosis Date  . Hypertension    Past Surgical History  Procedure Laterality Date  . Tubal ligation      Medications: Prior to Admission medications   Not on File  none  Allergies:  No Known Allergies  Social History:  reports that she has never smoked. She does not have any smokeless tobacco history on file. She reports that she does not drink alcohol or use illicit drugs.  Family History: History reviewed. No pertinent family history.  Physical Exam: Filed Vitals:   08/01/12 2310 08/02/12 0025 08/02/12 0027 08/02/12 0030  BP: 98/64 96/51 102/54 93/56  Pulse: 75 59 61 75  Temp:      TempSrc:      Resp:      SpO2: 94%      General appearance: alert, cooperative and no distress Head: Normocephalic, without obvious abnormality, atraumatic Eyes: negative Nose: Nares normal. Septum midline. Mucosa normal. No drainage or sinus tenderness. Throat: normal findings: lips normal without lesions Neck: no JVD and supple, symmetrical, trachea midline Lungs: clear to auscultation bilaterally Heart: regular rate and rhythm, S1, S2 normal, no murmur, click, rub or gallop Abdomen: soft, non-tender; bowel sounds normal; no masses,  no organomegaly Extremities: extremities normal, atraumatic, no cyanosis or edema Pulses: 2+ and symmetric Skin: Skin color, texture, turgor normal. No rashes or lesions Neurologic: Grossly normal    Labs on Admission:    Recent Labs  08/01/12 1639  NA 136  K 3.6  CL 99  CO2 25  GLUCOSE 98  BUN 12  CREATININE 0.75  CALCIUM 9.6    Recent Labs  08/01/12 1639  AST 21  ALT 19  ALKPHOS 71  BILITOT 0.9  PROT 8.5*  ALBUMIN 4.1    Recent Labs  08/01/12 1639  LIPASE 50    Recent Labs  08/01/12 1639  WBC 9.2  NEUTROABS 7.2  HGB 14.5  HCT 41.6  MCV 86.7  PLT 285    Radiological Exams on Admission: Ct Abdomen Pelvis W Contrast  08/02/2012  *RADIOLOGY REPORT*  Clinical Data: Nausea and vomiting.  Diffuse abdominal pain for 1 day.  CT ABDOMEN AND PELVIS WITH CONTRAST  Technique:  Multidetector CT imaging of the abdomen and pelvis was performed following the standard protocol during bolus administration of intravenous contrast.  Contrast: OMNIPAQUE IOHEXOL 300 MG/ML  SOLN  Comparison: None.  Findings: Atelectasis or infiltration in both lung bases.  The liver, spleen, gallbladder, pancreas, adrenal glands, abdominal aorta, retroperitoneal lymph nodes, and kidneys are unremarkable. There appears to be a small accessory spleen.  The stomach and small bowel are decompressed.  Colon is decompressed.  No free air or free fluid in the abdomen.  Pelvis:  The uterus and ovaries are not enlarged.  The bladder wall is not thickened.  No free or loculated pelvic fluid collections. The appendix is normal.  No diverticulitis.  Normal alignment of the  lumbar vertebrae.  IMPRESSION: No acute process demonstrated in the abdomen or pelvis.   Original Report Authenticated By: Burman Nieves, M.D.    Dg Abd Acute W/chest  08/01/2012  *RADIOLOGY REPORT*  Clinical Data: Chest pain, shortness of breath, upper abdominal pain, nausea, vomiting, history hypertension  ACUTE ABDOMEN SERIES (ABDOMEN 2 VIEW & CHEST 1 VIEW)  Comparison: Chest radiographs 11/17/2011  Findings: Upper-normal size of cardiac silhouette. Mediastinal contours and pulmonary vascularity normal. Lungs clear. No pleural effusion or pneumothorax.  Normal bowel gas pattern. No bowel dilatation, bowel wall thickening or free intraperitoneal air. Mild broad-based levoconvex thoracolumbar scoliosis. No urinary tract calcification.  IMPRESSION: No acute abnormalities.   Original Report Authenticated By: Ulyses Southward, M.D.     Assessment/Plan  42 yo female with epi abd pain/n/v Principal Problem:   Nausea & vomiting Active Problems:   Abdominal pain, acute, epigastric   Hypertension  Probable viral gastroenteritis.  W/u negative.  Ivf.  Iv zofran and dilaudid.  abd exam benign.  If symptoms do not improve or worsen in the next 24 hours consider other causes.  Med bed.  obs.  DAVID,RACHAL A 08/02/2012, 1:11 AM

## 2012-08-02 NOTE — Progress Notes (Signed)
NURSING PROGRESS NOTE  Debbie Davidson [id number] Admission Data: 08/02/2012 3:15 AM Attending Provider: Tarry Kos, MD ION:[id number], MD Code Status: full    Debbie Davidson is a 42 y.o. female patient admitted from ED  No acute distress noted.  No c/o shortness of breath, no c/o chest pain.   Blood pressure 108/72, pulse 62, temperature 98.1 F (36.7 C), temperature source Oral, resp. rate 16, height 5\' 4"  (1.626 m), weight 109.1 kg (240 lb 8.4 oz), last menstrual period 07/25/2012, SpO2 94.00%.   IV Fluids:  IV in place, occlusive dsg intact without redness, IV cath hand left, condition patent and no redness normal saline.   Allergies:  Review of patient's allergies indicates no known allergies.  Past Medical History:   has a past medical history of Hypertension.  Past Surgical History:   has past surgical history that includes Tubal ligation.  Social History:   reports that she has never smoked. She does not have any smokeless tobacco history on file. She reports that she does not drink alcohol or use illicit drugs.  Skin: intact  Orientation to room, and floor completed with information packet given to patient/family. Admission INP armband ID verified with patient/family, and in place.   SR up x 2, fall assessment complete, with patient and family able to verbalize understanding of risk associated with falls, and verbalized understanding to call for assistance before getting out of bed.   Call light within reach. Patient able to voice and demonstrate understanding of unit orientation instructions.   Will continue to evaluate and treat per MD orders.  Eshani Maestre, Quillian Quince, RN

## 2012-08-02 NOTE — Care Management Note (Signed)
    Page 1 of 1   08/06/2012     1:58:58 PM   CARE MANAGEMENT NOTE 08/06/2012  Patient:  Debbie Davidson, Debbie Davidson   Account Number:  [id number]  Date Initiated:  08/02/2012  Documentation initiated by:  Letha Cape  Subjective/Objective Assessment:   dx n/v, abd pain  admit- lives with spouse. pta indep.     Action/Plan:   Anticipated DC Date:  08/05/2012   Anticipated DC Plan:  HOME/SELF CARE      DC Planning Services  CM consult      Choice offered to / List presented to:             Status of service:  Completed, signed off Medicare Important Message given?   (If response is "NO", the following Medicare IM given date fields will be blank) Date Medicare IM given:   Date Additional Medicare IM given:    Discharge Disposition:  HOME/SELF CARE  Per UR Regulation:  Reviewed for med. necessity/level of care/duration of stay  If discussed at Long Length of Stay Meetings, dates discussed:    Comments:  08/06/12 13:58 Letha Cape RN, BSN [phone] patient dc to home on 3/2.  08/02/12 16:33 Letha Cape RN, BSN [phone] patient lives with spouse, pta indep.  No needs anticipated.  NCM will continue to follow for dc needs.

## 2012-08-02 NOTE — ED Provider Notes (Signed)
Medical screening examination/treatment/procedure(s) were performed by non-physician practitioner and as supervising physician I was immediately available for consultation/collaboration.   Richardean Canal, MD 08/02/12 [phone]

## 2012-08-02 NOTE — Progress Notes (Signed)
[facility] PROGRESS NOTE  Debbie Davidson:[id number] DOB: [birth year] DOA: 08/01/2012 PCP: Ivy Lynn, MD  Assessment/Plan: Principal Problem:   Nausea & vomiting Active Problems:   Abdominal pain, acute, epigastric   Hypertension    1. 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 Imaging studies, including abdominal/pelvic CT scan, as well as abdominal X-ray, are negative for acute. Disease. She admits to fever/chills, but no pyrexia has been documented, and wcc is normal. Managing with bowel rest, iv fluids, PPI, antiemetics and analgesics. Following and repleting lytes as indicated. Patient appears to have a viral self-limited acute gastritis. Will check lipase for completeness. Vomited this AM.  2. Hypertension: BP is borderline to low at this time, likely due to volume depletion. . Pre-admission antihypertensives are on hold. Will manage with aggressive iv fluids.    Code Status: Full Code.  Family Communication:  Disposition Plan: to be determined.    Brief narrative: 42 year old female with history of HTN and previous T/L, comes in with nausea, vomiting and epigastric abdominal pain since 11:00 AM on 08/01/12. Vomitus was non-bloody. No fevers. No diarrhea. 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 Still has gallbladder. Symptoms improved with iv Dilaudid and Zofran in ED but could not hold anything down when she attempts to eat/drink. Admitted for further management.     Consultants:  N/A.   Procedures:  CXR/Abdomnial X-Ray.  CT Abdomen/Pelvis.   Antibiotics:  N/A.   HPI/Subjective: Vomited this AM. No diarrhea.   Objective: Vital signs in last 24 hours: Temp:  [97.5 F (36.4 C)-98.1 F (36.7 C)] 97.5 F (36.4 C) (02/27 0512) Pulse Rate:  [59-90] 64 (02/27 0512) Resp:  [14-24] 16 (02/27 0512) BP:  (86-131)/(38-82) 100/63 mmHg (02/27 0512) SpO2:  [93 %-100 %] 94 % (02/27 0512) Weight:  [109.1 kg (240 lb 8.4 oz)] 109.1 kg (240 lb 8.4 oz) (02/27 0203) Weight change:  Last BM Date: 07/31/12  Intake/Output from previous day: 02/26 0701 - 02/27 0700 In: 276.7 [I.V.:276.7] Out: -      Physical Exam: General: Appears weak, alert, communicative, fully oriented, not short of breath at rest.  HEENT:  No clinical pallor, no jaundice, no conjunctival injection or discharge. Hydration is fair.  NECK:  Supple, JVP not seen, no carotid bruits, no palpable lymphadenopathy, no palpable goiter. CHEST:  Clinically clear to auscultation, no wheezes, no crackles. HEART:  Sounds 1 and 2 heard, normal, regular, no murmurs. ABDOMEN:  Moderately obese, soft, no scars, has diffuse discomfort, but moderate epigastric tenderness. No palpable organomegaly, no palpable masses, normal bowel sounds. GENITALIA:  Not examined. LOWER EXTREMITIES:  No pitting edema, palpable peripheral pulses. MUSCULOSKELETAL SYSTEM:  Unremarkable.  CENTRAL NERVOUS SYSTEM:  No focal neurologic deficit on gross examination.  Lab Results:  Recent Labs  08/01/12 1639 08/02/12 0635  WBC 9.2 8.9  HGB 14.5 12.3  HCT 41.6 35.7*  PLT 285 256    Recent Labs  08/01/12 1639 08/02/12 0635  NA 136 136  K 3.6 3.3*  CL 99 101  CO2 25 26  GLUCOSE 98 103*  BUN 12 10  CREATININE 0.75 0.80  CALCIUM 9.6 8.7   No results found for this or any previous visit (from the past 240 hour(s)).   Studies/Results: Ct Abdomen Pelvis W Contrast  08/02/2012  *RADIOLOGY REPORT*  Clinical Data: Nausea and vomiting.  Diffuse abdominal pain  for 1 day.  CT ABDOMEN AND PELVIS WITH CONTRAST  Technique:  Multidetector CT imaging of the abdomen and pelvis was performed following the standard protocol during bolus administration of intravenous contrast.  Contrast: OMNIPAQUE IOHEXOL 300 MG/ML  SOLN  Comparison: None.  Findings: Atelectasis or  infiltration in both lung bases.  The liver, spleen, gallbladder, pancreas, adrenal glands, abdominal aorta, retroperitoneal lymph nodes, and kidneys are unremarkable. There appears to be a small accessory spleen.  The stomach and small bowel are decompressed.  Colon is decompressed.  No free air or free fluid in the abdomen.  Pelvis:  The uterus and ovaries are not enlarged.  The bladder wall is not thickened.  No free or loculated pelvic fluid collections. The appendix is normal.  No diverticulitis.  Normal alignment of the lumbar vertebrae.  IMPRESSION: No acute process demonstrated in the abdomen or pelvis.   Original Report Authenticated By: Burman Nieves, M.D.    Dg Abd Acute W/chest  08/01/2012  *RADIOLOGY REPORT*  Clinical Data: Chest pain, shortness of breath, upper abdominal pain, nausea, vomiting, history hypertension  ACUTE ABDOMEN SERIES (ABDOMEN 2 VIEW & CHEST 1 VIEW)  Comparison: Chest radiographs 11/17/2011  Findings: Upper-normal size of cardiac silhouette. Mediastinal contours and pulmonary vascularity normal. Lungs clear. No pleural effusion or pneumothorax. Normal bowel gas pattern. No bowel dilatation, bowel wall thickening or free intraperitoneal air. Mild broad-based levoconvex thoracolumbar scoliosis. No urinary tract calcification.  IMPRESSION: No acute abnormalities.   Original Report Authenticated By: Ulyses Southward, M.D.     Medications: Scheduled Meds: . sodium chloride   Intravenous STAT   Continuous Infusions: . sodium chloride 100 mL/hr at 08/02/12 0559   PRN Meds:.alum & mag hydroxide-simeth, HYDROmorphone (DILAUDID) injection, ondansetron (ZOFRAN) IV, ondansetron    LOS: 1 day   Ireta Pullman,CHRISTOPHER  [facility] Pager [phone]. If 8PM-8AM, please contact night-coverage at [email or web address], password [facility] 08/02/2012, 8:16 AM  LOS: 1 day

## 2012-08-03 DIAGNOSIS — K5289 Other specified noninfective gastroenteritis and colitis: Principal | ICD-10-CM | POA: Diagnosis present

## 2012-08-03 DIAGNOSIS — E876 Hypokalemia: Secondary | ICD-10-CM | POA: Diagnosis present

## 2012-08-03 LAB — CBC
HCT: 32 % — ABNORMAL LOW (ref 36.0–46.0)
MCH: 30.4 pg (ref 26.0–34.0)
MCHC: 34.7 g/dL (ref 30.0–36.0)
MCV: 87.7 fL (ref 78.0–100.0)
RDW: 13.1 % (ref 11.5–15.5)
WBC: 5.8 10*3/uL (ref 4.0–10.5)

## 2012-08-03 LAB — CLOSTRIDIUM DIFFICILE BY PCR: Toxigenic C. Difficile by PCR: NEGATIVE

## 2012-08-03 MED ORDER — POTASSIUM CHLORIDE 10 MEQ/100ML IV SOLN
10.0000 meq | INTRAVENOUS | Status: AC
  Administered 2012-08-03 (×4): 10 meq via INTRAVENOUS
  Filled 2012-08-03 (×4): qty 100

## 2012-08-03 NOTE — Progress Notes (Signed)
[facility] PROGRESS NOTE  Debbie Davidson ZOX:[id number] DOB: Sep 19, 1970 DOA: 08/01/2012 PCP: Ivy Lynn, MD  Assessment/Plan: Principal Problem:   Nausea & vomiting Active Problems:   Abdominal pain, acute, epigastric   Hypertension    1. 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 Imaging studies, including abdominal/pelvic CT scan, as well as abdominal X-ray, are negative for acute disease, and lipase is normal. She had fever/chills pre-admission, but no pyrexia has been documented so far, and wcc is normal. Patient developed diarrhea on 08/02/12, and C.Difficile PCR is pending. Managing with bowel rest, iv fluids, PPI, antiemetics and analgesics. Following and repleting lytes as indicated. Patient appears to have a viral self-limited acute gastroenteritis. Managing some clears at this time, albeit inconsistently.  2. Hypertension: BP was borderline to low on 08/02/12, likely due to volume depletion. . Pre-admission antihypertensives are on hold. Managing with aggressive iv fluids, and BP has normalized today. .    Code Status: Full Code.  Family Communication:  Disposition Plan: to be determined.    Brief narrative: 42 year old female with history of HTN and previous T/L, comes in with nausea, vomiting and epigastric abdominal pain since 11:00 AM on 08/01/12. Vomitus was non-bloody. No fevers. No diarrhea. 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 Still has gallbladder. Symptoms improved with iv Dilaudid and Zofran in ED but could not hold anything down when she attempts to eat/drink. Admitted for further management.     Consultants:  N/A.   Procedures:  CXR/Abdomnial X-Ray.  CT Abdomen/Pelvis.   Antibiotics:  N/A.   HPI/Subjective: Feels better, but still having diarrhea.   Objective: Vital signs in last  24 hours: Temp:  [98 F (36.7 C)-98.6 F (37 C)] 98 F (36.7 C) (02/28 0547) Pulse Rate:  [63-69] 68 (02/28 0547) Resp:  [12-20] 12 (02/28 0547) BP: (100-130)/(59-68) 100/68 mmHg (02/28 0547) SpO2:  [94 %-97 %] 94 % (02/28 0547) Weight change:  Last BM Date: 08/02/12  Intake/Output from previous day: 02/27 0701 - 02/28 0700 In: 1291.7 [P.O.:500; I.V.:791.7] Out: -      Physical Exam: General: Alert, communicative, fully oriented, not short of breath at rest.  HEENT:  No clinical pallor, no jaundice, no conjunctival injection or discharge. Hydration is fair.  NECK:  Supple, JVP not seen, no carotid bruits, no palpable lymphadenopathy, no palpable goiter. CHEST:  Clinically clear to auscultation, no wheezes, no crackles. HEART:  Sounds 1 and 2 heard, normal, regular, no murmurs. ABDOMEN:  Moderately obese, soft, no scars, has diffuse discomfort, but moderate epigastric tenderness. No palpable organomegaly, no palpable masses, normal bowel sounds. GENITALIA:  Not examined. LOWER EXTREMITIES:  No pitting edema, palpable peripheral pulses. MUSCULOSKELETAL SYSTEM:  Unremarkable.  CENTRAL NERVOUS SYSTEM:  No focal neurologic deficit on gross examination.  Lab Results:  Recent Labs  08/02/12 0635 08/03/12 0635  WBC 8.9 5.8  HGB 12.3 11.1*  HCT 35.7* 32.0*  PLT 256 231    Recent Labs  08/01/12 1639 08/02/12 0635  NA 136 136  K 3.6 3.3*  CL 99 101  CO2 25 26  GLUCOSE 98 103*  BUN 12 10  CREATININE 0.75 0.80  CALCIUM 9.6 8.7   No results found for this or any previous visit (from the past 240 hour(s)).   Studies/Results: Ct Abdomen Pelvis W Contrast  08/02/2012  *RADIOLOGY REPORT*  Clinical Data: Nausea and vomiting.  Diffuse abdominal pain for 1 day.  CT ABDOMEN AND PELVIS WITH CONTRAST  Technique:  Multidetector CT imaging of the abdomen and pelvis was performed following the standard protocol during bolus administration of intravenous contrast.  Contrast:  OMNIPAQUE IOHEXOL 300 MG/ML  SOLN  Comparison: None.  Findings: Atelectasis or infiltration in both lung bases.  The liver, spleen, gallbladder, pancreas, adrenal glands, abdominal aorta, retroperitoneal lymph nodes, and kidneys are unremarkable. There appears to be a small accessory spleen.  The stomach and small bowel are decompressed.  Colon is decompressed.  No free air or free fluid in the abdomen.  Pelvis:  The uterus and ovaries are not enlarged.  The bladder wall is not thickened.  No free or loculated pelvic fluid collections. The appendix is normal.  No diverticulitis.  Normal alignment of the lumbar vertebrae.  IMPRESSION: No acute process demonstrated in the abdomen or pelvis.   Original Report Authenticated By: Burman Nieves, M.D.    Dg Abd Acute W/chest  08/01/2012  *RADIOLOGY REPORT*  Clinical Data: Chest pain, shortness of breath, upper abdominal pain, nausea, vomiting, history hypertension  ACUTE ABDOMEN SERIES (ABDOMEN 2 VIEW & CHEST 1 VIEW)  Comparison: Chest radiographs 11/17/2011  Findings: Upper-normal size of cardiac silhouette. Mediastinal contours and pulmonary vascularity normal. Lungs clear. No pleural effusion or pneumothorax. Normal bowel gas pattern. No bowel dilatation, bowel wall thickening or free intraperitoneal air. Mild broad-based levoconvex thoracolumbar scoliosis. No urinary tract calcification.  IMPRESSION: No acute abnormalities.   Original Report Authenticated By: Ulyses Southward, M.D.     Medications: Scheduled Meds: . metoCLOPramide (REGLAN) injection  5 mg Intravenous Q6H  . pantoprazole (PROTONIX) IV  40 mg Intravenous Q24H  . potassium chloride  10 mEq Intravenous Q1 Hr x 4   Continuous Infusions: . sodium chloride 125 mL/hr at 08/03/12 0108   PRN Meds:.acetaminophen, morphine injection, ondansetron (ZOFRAN) IV, ondansetron    LOS: 2 days   Codie Krogh,CHRISTOPHER  [facility] Pager [phone]. If 8PM-8AM, please contact night-coverage at [email or web address],  password [facility] 08/03/2012, 8:43 AM  LOS: 2 days

## 2012-08-04 LAB — CBC
HCT: 32.9 % — ABNORMAL LOW (ref 36.0–46.0)
MCH: 30.1 pg (ref 26.0–34.0)
MCV: 87.7 fL (ref 78.0–100.0)
RDW: 12.9 % (ref 11.5–15.5)
WBC: 5.6 10*3/uL (ref 4.0–10.5)

## 2012-08-04 LAB — BASIC METABOLIC PANEL
BUN: 7 mg/dL (ref 6–23)
Chloride: 107 mEq/L (ref 96–112)
Creatinine, Ser: 0.88 mg/dL (ref 0.50–1.10)
GFR calc Af Amer: >90 mL/min (ref >90)

## 2012-08-04 MED ORDER — INFLUENZA VIRUS VACC SPLIT PF IM SUSP
0.5000 mL | INTRAMUSCULAR | Status: AC
  Administered 2012-08-05: 0.5 mL via INTRAMUSCULAR
  Filled 2012-08-04: qty 0.5

## 2012-08-04 MED ORDER — PANTOPRAZOLE SODIUM 40 MG PO TBEC
40.0000 mg | DELAYED_RELEASE_TABLET | Freq: Every day | ORAL | Status: DC
  Administered 2012-08-05: 40 mg via ORAL
  Filled 2012-08-04: qty 1

## 2012-08-04 NOTE — Discharge Summary (Signed)
Physician Discharge Summary  MUSKAAN SMET AVW:[id number] DOB: 1971/02/11 DOA: 08/01/2012  PCP: Ivy Lynn, MD  Admit date: 08/01/2012 Discharge date: 08/05/2012  Time spent: 40 minutes  Recommendations for Outpatient Follow-up:  1. Follow up with PMD.  2. May return to regular duties on 08/07/12.   Discharge Diagnoses:  Principal Problem:   Nausea & vomiting Active Problems:   Abdominal pain, acute, epigastric   Hypertension   Other and unspecified noninfectious gastroenteritis and colitis   Hypokalemia   Discharge Condition: Satisfactory.   Diet recommendation: Heart-Healthy.  Filed Weights   08/02/12 0203  Weight: 109.1 kg (240 lb 8.4 oz)    History of present illness:  42 year old female with history of HTN and previous T/L, comes in with nausea, vomiting and epigastric abdominal pain since 11:00 AM on 08/01/12. Vomitus was non-bloody. No fevers. No diarrhea. 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 Still has gallbladder. Symptoms improved with iv Dilaudid and Zofran in ED but could not hold anything down when she attempts to eat/drink. Admitted for further management.    Hospital Course:  1. 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 Imaging studies, including abdominal/pelvic CT scan, as well as abdominal X-ray, were negative for acute disease, and Lipase was normal. She had fever/chills pre-admission, but no pyrexia was documented during this hospitalization, and wcc remained normal. Patient developed diarrhea on 08/02/12, and C.Difficile PCR was negative. Managed with bowel rest, iv fluids, PPI, antiemetics and analgesics. Followed and repleted lytes as indicated. Clinically, patient appears to have had a viral self-limited acute gastroenteritis. As of 08/04/12, vomiting and diarrhea had resolved, and diet was  advanced without any deleterious consequences.  2. Hypertension: Patient has a history of HTN. BP was borderline to low on 08/02/12, likely due to volume depletion. Managed with aggressive iv fluids, and BP normalized.    Procedures:  See Below.   Consultations:  N/A.   Discharge Exam: Filed Vitals:   08/04/12 0606 08/04/12 1340 08/04/12 2126 08/05/12 0448  BP: 128/85 126/87 128/83 121/75  Pulse: 57 62 72 59  Temp: 98.3 F (36.8 C) 98 F (36.7 C) 98.1 F (36.7 C) 97.9 F (36.6 C)  TempSrc: Oral Oral Oral Oral  Resp: 20 18 17 17   Height:      Weight:      SpO2: 96% 98% 95% 94%    General: Alert, communicative, fully oriented, not short of breath at rest.  HEENT: No clinical pallor, no jaundice, no conjunctival injection or discharge. Hydration is satisfactory.  NECK: Supple, JVP not seen, no carotid bruits, no palpable lymphadenopathy, no palpable goiter.  CHEST: Clinically clear to auscultation, no wheezes, no crackles.  HEART: Sounds 1 and 2 heard, normal, regular, no murmurs.  ABDOMEN: Moderately obese, soft, only mild epigastric discomfort. No palpable organomegaly, no palpable masses, normal bowel sounds.  GENITALIA: Not examined.  LOWER EXTREMITIES: No pitting edema, palpable peripheral pulses.  MUSCULOSKELETAL SYSTEM: Unremarkable.  CENTRAL NERVOUS SYSTEM: No focal neurologic deficit on gross examination.  Discharge Instructions      Discharge Orders   Future Orders Complete By Expires     Diet - low sodium heart healthy  As directed     Increase activity slowly  As directed         Medication List    TAKE these medications       pantoprazole  40 MG tablet  Commonly known as:  PROTONIX  Take 1 tablet (40 mg total) by mouth daily at 6 (six) AM.       Follow-up Information   Follow up with OTERO-TRUITT,TESSIE, MD. (As needed)        The results of significant diagnostics from this hospitalization (including imaging, microbiology, ancillary and  laboratory) are listed below for reference.    Significant Diagnostic Studies: Ct Abdomen Pelvis W Contrast  08/02/2012  *RADIOLOGY REPORT*  Clinical Data: Nausea and vomiting.  Diffuse abdominal pain for 1 day.  CT ABDOMEN AND PELVIS WITH CONTRAST  Technique:  Multidetector CT imaging of the abdomen and pelvis was performed following the standard protocol during bolus administration of intravenous contrast.  Contrast: OMNIPAQUE IOHEXOL 300 MG/ML  SOLN  Comparison: None.  Findings: Atelectasis or infiltration in both lung bases.  The liver, spleen, gallbladder, pancreas, adrenal glands, abdominal aorta, retroperitoneal lymph nodes, and kidneys are unremarkable. There appears to be a small accessory spleen.  The stomach and small bowel are decompressed.  Colon is decompressed.  No free air or free fluid in the abdomen.  Pelvis:  The uterus and ovaries are not enlarged.  The bladder wall is not thickened.  No free or loculated pelvic fluid collections. The appendix is normal.  No diverticulitis.  Normal alignment of the lumbar vertebrae.  IMPRESSION: No acute process demonstrated in the abdomen or pelvis.   Original Report Authenticated By: Burman Nieves, M.D.    Dg Abd Acute W/chest  08/01/2012  *RADIOLOGY REPORT*  Clinical Data: Chest pain, shortness of breath, upper abdominal pain, nausea, vomiting, history hypertension  ACUTE ABDOMEN SERIES (ABDOMEN 2 VIEW & CHEST 1 VIEW)  Comparison: Chest radiographs 11/17/2011  Findings: Upper-normal size of cardiac silhouette. Mediastinal contours and pulmonary vascularity normal. Lungs clear. No pleural effusion or pneumothorax. Normal bowel gas pattern. No bowel dilatation, bowel wall thickening or free intraperitoneal air. Mild broad-based levoconvex thoracolumbar scoliosis. No urinary tract calcification.  IMPRESSION: No acute abnormalities.   Original Report Authenticated By: Ulyses Southward, M.D.     Microbiology: Recent Results (from the past 240 hour(s))   CLOSTRIDIUM DIFFICILE BY PCR     Status: None   Collection Time    08/02/12 10:36 PM      Result Value Range Status   C difficile by pcr NEGATIVE  NEGATIVE Final     Labs: Basic Metabolic Panel:  Recent Labs Lab 08/01/12 1639 08/02/12 0635 08/03/12 0635 08/04/12 0500  NA 136 136  --  139  K 3.6 3.3*  --  3.9  CL 99 101  --  107  CO2 25 26  --  24  GLUCOSE 98 103*  --  107*  BUN 12 10  --  7  CREATININE 0.75 0.80  --  0.88  CALCIUM 9.6 8.7  --  8.4  MG  --  2.0 2.0  --    Liver Function Tests:  Recent Labs Lab 08/01/12 1639  AST 21  ALT 19  ALKPHOS 71  BILITOT 0.9  PROT 8.5*  ALBUMIN 4.1    Recent Labs Lab 08/01/12 1639 08/02/12 0635  LIPASE 50 50   No results found for this basename: AMMONIA,  in the last 168 hours CBC:  Recent Labs Lab 08/01/12 1639 08/02/12 0635 08/03/12 0635 08/04/12 0500  WBC 9.2 8.9 5.8 5.6  NEUTROABS 7.2  --   --   --   HGB 14.5 12.3 11.1* 11.3*  HCT 41.6 35.7* 32.0*  32.9*  MCV 86.7 86.7 87.7 87.7  PLT 285 256 231 245   Cardiac Enzymes: No results found for this basename: CKTOTAL, CKMB, CKMBINDEX, TROPONINI,  in the last 168 hours BNP: BNP (last 3 results) No results found for this basename: PROBNP,  in the last 8760 hours CBG: No results found for this basename: GLUCAP,  in the last 168 hours     Signed:  OTI,CHRISTOPHER  [facility] 08/05/2012, 8:03 AM

## 2012-08-04 NOTE — Progress Notes (Signed)
[facility] PROGRESS NOTE  Debbie Davidson ZOX:[id number] DOB: Sep 14, 1970 DOA: 08/01/2012 PCP: Ivy Lynn, MD  Assessment/Plan: Principal Problem:   Nausea & vomiting Active Problems:   Abdominal pain, acute, epigastric   Hypertension   Other and unspecified noninfectious gastroenteritis and colitis   Hypokalemia    1. 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 Imaging studies, including abdominal/pelvic CT scan, as well as abdominal X-ray, are negative for acute disease, and lipase was normal. She had fever/chills pre-admission, but no pyrexia has been documented so far, and wcc is normal. Patient developed diarrhea on 08/02/12, and C.Difficile PCR was negative. Managed with bowel rest, iv fluids, PPI, antiemetics and analgesics. Following and repleting lytes as indicated. Clinically, patient appears to have a viral self-limited acute gastroenteritis. No vomiting or diarrhea today. Advancing diet.  2. Hypertension: BP was borderline to low on 08/02/12, likely due to volume depletion. Pre-admission antihypertensives are on hold. Managed  with aggressive iv fluids, and BP has normalized. Have reduced iv fluids. Will observe.   Code Status: Full Code.  Family Communication:  Disposition Plan: to be determined. Aiming possible discharge on 08/05/12.    Brief narrative: 42 year old female with history of HTN and previous T/L, comes in with nausea, vomiting and epigastric abdominal pain since 11:00 AM on 08/01/12. Vomitus was non-bloody. No fevers. No diarrhea. 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 Still has gallbladder. Symptoms improved with iv Dilaudid and Zofran in ED but could not hold anything down when she attempts to eat/drink. Admitted for further management.     Consultants:  N/A.   Procedures:  CXR/Abdomnial  X-Ray.  CT Abdomen/Pelvis.   Antibiotics:  N/A.   HPI/Subjective: Asymptomatic.  Objective: Vital signs in last 24 hours: Temp:  [98.3 F (36.8 C)-98.9 F (37.2 C)] 98.3 F (36.8 C) (03/01 0606) Pulse Rate:  [57-79] 57 (03/01 0606) Resp:  [18-20] 20 (03/01 0606) BP: (105-128)/(70-85) 128/85 mmHg (03/01 0606) SpO2:  [95 %-97 %] 96 % (03/01 0606) Weight change:  Last BM Date: 08/04/12  Intake/Output from previous day: 02/28 0701 - 03/01 0700 In: 3285 [P.O.:760; I.V.:2525] Out: 400 [Urine:400] Total I/O In: 240 [P.O.:240] Out: -    Physical Exam: General: Alert, communicative, fully oriented, not short of breath at rest.  HEENT:  No clinical pallor, no jaundice, no conjunctival injection or discharge. Hydration is satisfactory.  NECK:  Supple, JVP not seen, no carotid bruits, no palpable lymphadenopathy, no palpable goiter. CHEST:  Clinically clear to auscultation, no wheezes, no crackles. HEART:  Sounds 1 and 2 heard, normal, regular, no murmurs. ABDOMEN:  Moderately obese, soft, only mild epigastric discomfort. No palpable organomegaly, no palpable masses, normal bowel sounds. GENITALIA:  Not examined. LOWER EXTREMITIES:  No pitting edema, palpable peripheral pulses. MUSCULOSKELETAL SYSTEM:  Unremarkable.  CENTRAL NERVOUS SYSTEM:  No focal neurologic deficit on gross examination.  Lab Results:  Recent Labs  08/03/12 0635 08/04/12 0500  WBC 5.8 5.6  HGB 11.1* 11.3*  HCT 32.0* 32.9*  PLT 231 245    Recent Labs  08/02/12 0635 08/04/12 0500  NA 136 139  K 3.3* 3.9  CL 101 107  CO2 26 24  GLUCOSE 103* 107*  BUN 10 7  CREATININE 0.80 0.88  CALCIUM 8.7 8.4   Recent Results (from the past 240 hour(s))  CLOSTRIDIUM DIFFICILE BY PCR     Status: None  Collection Time    08/02/12 10:36 PM      Result Value Range Status   C difficile by pcr NEGATIVE  NEGATIVE Final     Studies/Results: No results found.  Medications: Scheduled Meds: . [START ON  08/05/2012] pantoprazole  40 mg Oral Q0600   Continuous Infusions: . sodium chloride 125 mL/hr at 08/04/12 0631   PRN Meds:.acetaminophen, morphine injection, ondansetron (ZOFRAN) IV, ondansetron    LOS: 3 days   Debbie Davidson,CHRISTOPHER  [facility] Pager [phone]. If 8PM-8AM, please contact night-coverage at [email or web address], password [facility] 08/04/2012, 12:21 PM  LOS: 3 days

## 2012-08-05 LAB — BASIC METABOLIC PANEL
CO2: 28 mEq/L (ref 19–32)
Chloride: 102 mEq/L (ref 96–112)
Creatinine, Ser: 0.93 mg/dL (ref 0.50–1.10)
GFR calc Af Amer: 87 mL/min — ABNORMAL LOW (ref >90)
Potassium: 4.1 mEq/L (ref 3.5–5.1)
Sodium: 137 mEq/L (ref 135–145)

## 2012-08-05 MED ORDER — PANTOPRAZOLE SODIUM 40 MG PO TBEC: 40.0000 mg | DELAYED_RELEASE_TABLET | Freq: Every day | ORAL | Status: DC

## 2012-08-05 NOTE — Progress Notes (Signed)
Debbie Davidson to be D/C'd Home per MD order.  Discussed with the patient and all questions fully answered.    Medication List    TAKE these medications       pantoprazole 40 MG tablet  Commonly known as:  PROTONIX  Take 1 tablet (40 mg total) by mouth daily at 6 (six) AM.        VVS, Skin clean, dry and intact without evidence of skin break down, no evidence of skin tears noted. IV catheter discontinued intact. Site without signs and symptoms of complications. Dressing and pressure applied.  An After Visit Summary was printed and given to the patient. Follow up appointments , new prescriptions and medication administration times given. Gave pt education and handout on gastroenteritis. Patient escorted via WC, and D/C home via private auto.  Cindra Eves, RN 08/05/2012 5:45 PM

## 2012-12-11 ENCOUNTER — Emergency Department (HOSPITAL_COMMUNITY)
Admission: EM | Admit: 2012-12-11 | Discharge: 2012-12-11 | Disposition: A | Discharge status: Home / Self Care | Payer: BC Managed Care – PPO | Attending: Emergency Medicine | Admitting: Emergency Medicine

## 2012-12-11 ENCOUNTER — Encounter (HOSPITAL_COMMUNITY): Payer: Self-pay | Admitting: Emergency Medicine

## 2012-12-11 DIAGNOSIS — H53149 Visual discomfort, unspecified: Secondary | ICD-10-CM | POA: Insufficient documentation

## 2012-12-11 DIAGNOSIS — R51 Headache: Secondary | ICD-10-CM | POA: Insufficient documentation

## 2012-12-11 DIAGNOSIS — R112 Nausea with vomiting, unspecified: Secondary | ICD-10-CM | POA: Insufficient documentation

## 2012-12-11 DIAGNOSIS — H53459 Other localized visual field defect, unspecified eye: Secondary | ICD-10-CM | POA: Insufficient documentation

## 2012-12-11 DIAGNOSIS — Z79899 Other long term (current) drug therapy: Secondary | ICD-10-CM | POA: Insufficient documentation

## 2012-12-11 LAB — POCT I-STAT, CHEM 8
Calcium, Ion: 1.18 mmol/L (ref 1.12–1.23)
Chloride: 104 mEq/L (ref 96–112)
Glucose, Bld: 98 mg/dL (ref 70–99)
HCT: 44 % (ref 36.0–46.0)
TCO2: 25 mmol/L (ref 0–100)

## 2012-12-11 LAB — URINALYSIS, ROUTINE W REFLEX MICROSCOPIC
Ketones, ur: NEGATIVE mg/dL
Nitrite: NEGATIVE
Specific Gravity, Urine: 1.016 (ref 1.005–1.030)
pH: 7 (ref 5.0–8.0)

## 2012-12-11 LAB — URINE MICROSCOPIC-ADD ON

## 2012-12-11 MED ORDER — METOCLOPRAMIDE HCL 10 MG PO TABS: 10.0000 mg | ORAL_TABLET | Freq: Four times a day (QID) | ORAL | Status: DC | PRN

## 2012-12-11 MED ORDER — HYDROCODONE-ACETAMINOPHEN 5-325 MG PO TABS: 1.0000 | ORAL_TABLET | Freq: Four times a day (QID) | ORAL | Status: DC | PRN

## 2012-12-11 MED ORDER — ONDANSETRON 4 MG PO TBDP
8.0000 mg | ORAL_TABLET | ORAL | Status: AC
  Administered 2012-12-11: 8 mg via ORAL
  Filled 2012-12-11: qty 2

## 2012-12-11 MED ORDER — METOCLOPRAMIDE HCL 5 MG/ML IJ SOLN
10.0000 mg | INTRAMUSCULAR | Status: AC
  Administered 2012-12-11: 10 mg via INTRAVENOUS
  Filled 2012-12-11: qty 2

## 2012-12-11 MED ORDER — HYDROCODONE-ACETAMINOPHEN 5-325 MG PO TABS
1.0000 | ORAL_TABLET | ORAL | Status: AC
Start: 2012-12-11 — End: 2012-12-11
  Administered 2012-12-11: 1 via ORAL
  Filled 2012-12-11: qty 1

## 2012-12-11 MED ORDER — ONDANSETRON HCL 4 MG/2ML IJ SOLN
4.0000 mg | INTRAMUSCULAR | Status: AC
  Administered 2012-12-11: 4 mg via INTRAVENOUS
  Filled 2012-12-11: qty 2

## 2012-12-11 NOTE — ED Notes (Signed)
Pt c/o hypertension with dizziness and lightheadedness today; pt sts hx of same with htn; pt sts off meds x 1 year with diet controlled

## 2012-12-11 NOTE — ED Provider Notes (Signed)
History    CSN: [id number] Arrival date & time 12/11/12  1757  First MD Initiated Contact with Patient 12/11/12 1817     Chief Complaint  Patient presents with  . Hypertension   (Consider location/radiation/quality/duration/timing/severity/associated sxs/prior Treatment) HPI  Developed gradual onset with headache 5 days ago throbbing bitemporal,accompanied by By scotomata, photophobianausea and vomiting. She is treated herself with Tylenol without relief. Reports having vomited multiple times. He felt that it may have been related to blood pressure which she measured at 150/92 earlier today.headache is presently improved over earlier today. She had a similar headaches several months ago for which she attributes to elevated blood pressure.nothing makes symptoms better or worse. No associated fever no trauma no other complaint. Past Medical History  Diagnosis Date  . Hypertension    Past Surgical History  Procedure Laterality Date  . Tubal ligation     History reviewed. No pertinent family history. History  Substance Use Topics  . Smoking status: Never Smoker   . Smokeless tobacco: Not on file  . Alcohol Use: No   OB History   Grav Para Term Preterm Abortions TAB SAB Ect Mult Living                 Review of Systems  Constitutional: Negative.   Eyes: Positive for photophobia and visual disturbance.       Scotomata  Respiratory: Negative.   Cardiovascular: Negative.   Gastrointestinal: Positive for nausea.  Musculoskeletal: Negative.   Skin: Negative.   Neurological: Positive for headaches.  Psychiatric/Behavioral: Negative.     Allergies  Review of patient's allergies indicates no known allergies.  Home Medications   Current Outpatient Rx  Name  Route  Sig  Dispense  Refill  . hydrochlorothiazide (HYDRODIURIL) 25 MG tablet   Oral   Take 25 mg by mouth daily as needed. Whenever blood pressure is high          BP 145/83  Pulse 68  Temp(Src) 98.2 F (36.8 C)  (Oral)  Resp 16  SpO2 98% Physical Exam  Nursing note and vitals reviewed. Constitutional: She is oriented to person, place, and time. She appears well-developed and well-nourished. No distress.  Alert Glasgow Coma Score 15  HENT:  Head: Normocephalic and atraumatic.  Eyes: Conjunctivae are normal. Pupils are equal, round, and reactive to light.  Neck: Neck supple. No tracheal deviation present. No thyromegaly present.  Cardiovascular: Normal rate and regular rhythm.   No murmur heard. Pulmonary/Chest: Effort normal and breath sounds normal.  Abdominal: Soft. Bowel sounds are normal. She exhibits no distension. There is no tenderness.  Musculoskeletal: Normal range of motion. She exhibits no edema and no tenderness.  Neurological: She is alert and oriented to person, place, and time. She has normal reflexes. No cranial nerve deficit. She exhibits abnormal muscle tone. Coordination normal.  Gait normal Romberg normal pronator drift normal  Skin: Skin is warm and dry. No rash noted.  Psychiatric: She has a normal mood and affect.    ED Course  Procedures (including critical care time) Labs Reviewed  CBC WITH DIFFERENTIAL  COMPREHENSIVE METABOLIC PANEL  URINALYSIS, ROUTINE W REFLEX MICROSCOPIC   No results found. No diagnosis found. 1930 p.m. Patient states pain improved and nausea improved after treatment with intravenous Reglan. Requesting more pain medicine. Norco ordered.2020 p.m. She became nauseated upon drinking water. Zofran ordered. 9:25 PM patient feels much improved radial home. She is able to drink without difficulty. Results for orders placed during the hospital encounter  of 12/11/12  URINALYSIS, ROUTINE W REFLEX MICROSCOPIC      Result Value Range   Color, Urine RED (*) YELLOW   APPearance HAZY (*) CLEAR   Specific Gravity, Urine 1.016  1.005 - 1.030   pH 7.0  5.0 - 8.0   Glucose, UA NEGATIVE  NEGATIVE mg/dL   Hgb urine dipstick LARGE (*) NEGATIVE   Bilirubin  Urine NEGATIVE  NEGATIVE   Ketones, ur NEGATIVE  NEGATIVE mg/dL   Protein, ur NEGATIVE  NEGATIVE mg/dL   Urobilinogen, UA 0.2  0.0 - 1.0 mg/dL   Nitrite NEGATIVE  NEGATIVE   Leukocytes, UA SMALL (*) NEGATIVE  URINE MICROSCOPIC-ADD ON      Result Value Range   Squamous Epithelial / LPF FEW (*) RARE   WBC, UA 0-2  <3 WBC/hpf   RBC / HPF TOO NUMEROUS TO COUNT  <3 RBC/hpf   Bacteria, UA FEW (*) RARE  POCT I-STAT, CHEM 8      Result Value Range   Sodium 141  135 - 145 mEq/L   Potassium 3.7  3.5 - 5.1 mEq/L   Chloride 104  96 - 112 mEq/L   BUN 11  6 - 23 mg/dL   Creatinine, Ser 9.56  0.50 - 1.10 mg/dL   Glucose, Bld 98  70 - 99 mg/dL   Calcium, Ion 2.13  0.86 - 1.23 mmol/L   TCO2 25  0 - 100 mmol/L   Hemoglobin 15.0  12.0 - 15.0 g/dL   HCT 57.8  46.9 - 62.9 %   No results found.  MDM  I don't feel that patient's headache is all related to her blood pressure.hematuria likely secondary affect the patient is currently on her menses Plan prescription Norco., Reglan. Follow up [facility] Diagnosis nonspecific headache  Doug Sou, MD 12/11/12 2128

## 2012-12-11 NOTE — ED Notes (Signed)
Pt reports history of HTN that has been controlled with exercise and diet. States that she went to a funeral that was outside on Sunday and started feeling "bad". States that her BP was "very high", had a bad headache and became nauseated with vomiting. States that she also has been dizzy and photophobic. Pt has a history of severe headaches. Pt alert and oriented x 4, neuro intact. Denies diarrhea, fever.

## 2014-10-10 ENCOUNTER — Emergency Department (HOSPITAL_COMMUNITY)
Admission: EM | Admit: 2014-10-10 | Discharge: 2014-10-10 | Disposition: A | Discharge status: Home / Self Care | Payer: BC Managed Care – PPO | Attending: Emergency Medicine | Admitting: Emergency Medicine

## 2014-10-10 ENCOUNTER — Encounter (HOSPITAL_COMMUNITY): Payer: Self-pay | Admitting: *Deleted

## 2014-10-10 DIAGNOSIS — H1132 Conjunctival hemorrhage, left eye: Secondary | ICD-10-CM | POA: Diagnosis not present

## 2014-10-10 DIAGNOSIS — H5712 Ocular pain, left eye: Secondary | ICD-10-CM | POA: Diagnosis not present

## 2014-10-10 DIAGNOSIS — R42 Dizziness and giddiness: Secondary | ICD-10-CM | POA: Diagnosis not present

## 2014-10-10 DIAGNOSIS — H109 Unspecified conjunctivitis: Secondary | ICD-10-CM

## 2014-10-10 DIAGNOSIS — H11422 Conjunctival edema, left eye: Secondary | ICD-10-CM | POA: Insufficient documentation

## 2014-10-10 DIAGNOSIS — I10 Essential (primary) hypertension: Secondary | ICD-10-CM | POA: Insufficient documentation

## 2014-10-10 DIAGNOSIS — R51 Headache: Secondary | ICD-10-CM | POA: Insufficient documentation

## 2014-10-10 DIAGNOSIS — R079 Chest pain, unspecified: Secondary | ICD-10-CM | POA: Diagnosis not present

## 2014-10-10 DIAGNOSIS — H53149 Visual discomfort, unspecified: Secondary | ICD-10-CM | POA: Diagnosis not present

## 2014-10-10 DIAGNOSIS — H578 Other specified disorders of eye and adnexa: Secondary | ICD-10-CM | POA: Diagnosis present

## 2014-10-10 LAB — CBC WITH DIFFERENTIAL/PLATELET
BASOS ABS: 0 10*3/uL (ref 0.0–0.1)
Basophils Relative: 0 % (ref 0–1)
EOS PCT: 2 % (ref 0–5)
Eosinophils Absolute: 0.1 10*3/uL (ref 0.0–0.7)
HCT: 33.6 % — ABNORMAL LOW (ref 36.0–46.0)
Hemoglobin: 11.2 g/dL — ABNORMAL LOW (ref 12.0–15.0)
LYMPHS PCT: 37 % (ref 12–46)
Lymphs Abs: 2.7 10*3/uL (ref 0.7–4.0)
MCH: 29.6 pg (ref 26.0–34.0)
MCHC: 33.3 g/dL (ref 30.0–36.0)
MCV: 88.7 fL (ref 78.0–100.0)
Monocytes Absolute: 0.4 10*3/uL (ref 0.1–1.0)
Monocytes Relative: 6 % (ref 3–12)
Neutro Abs: 3.9 10*3/uL (ref 1.7–7.7)
Neutrophils Relative %: 55 % (ref 43–77)
PLATELETS: 247 10*3/uL (ref 150–400)
RBC: 3.79 MIL/uL — AB (ref 3.87–5.11)
RDW: 13.4 % (ref 11.5–15.5)
WBC: 7.1 10*3/uL (ref 4.0–10.5)

## 2014-10-10 LAB — BASIC METABOLIC PANEL
ANION GAP: 8 (ref 5–15)
BUN: 13 mg/dL (ref 6–20)
CHLORIDE: 107 mmol/L (ref 101–111)
CO2: 24 mmol/L (ref 22–32)
Calcium: 9 mg/dL (ref 8.9–10.3)
Creatinine, Ser: 0.87 mg/dL (ref 0.44–1.00)
Glucose, Bld: 128 mg/dL — ABNORMAL HIGH (ref 70–99)
Potassium: 3.7 mmol/L (ref 3.5–5.1)
SODIUM: 139 mmol/L (ref 135–145)

## 2014-10-10 LAB — I-STAT TROPONIN, ED: TROPONIN I, POC: 0.01 ng/mL (ref 0.00–0.08)

## 2014-10-10 MED ORDER — HYDROCODONE-ACETAMINOPHEN 5-325 MG PO TABS: ORAL_TABLET | ORAL | Status: DC

## 2014-10-10 MED ORDER — PROPARACAINE HCL 0.5 % OP SOLN
1.0000 [drp] | Freq: Once | OPHTHALMIC | Status: AC
  Administered 2014-10-10: 1 [drp] via OPHTHALMIC
  Filled 2014-10-10: qty 15

## 2014-10-10 MED ORDER — ERYTHROMYCIN 5 MG/GM OP OINT: TOPICAL_OINTMENT | OPHTHALMIC | Status: DC

## 2014-10-10 MED ORDER — FLUORESCEIN SODIUM 1 MG OP STRP
1.0000 | ORAL_STRIP | Freq: Once | OPHTHALMIC | Status: AC
  Administered 2014-10-10: 1 via OPHTHALMIC
  Filled 2014-10-10: qty 1

## 2014-10-10 NOTE — ED Provider Notes (Signed)
CSN: [id number]     Arrival date & time 10/10/14  0142 History  This chart was scribed for Pricilla LovelessScott Rozann Holts, MD by Freida Busmaniana Omoyeni, ED Scribe. This patient was seen in room B18C/B18C and the patient's care was started 2:40 AM.     Chief Complaint  Patient presents with  . Eye Problem    The history is provided by the patient. No language interpreter was used.     HPI Comments:  Debbie Davidson is a 44 y.o. female who presents to the Emergency Department complaining of redness, tingling, mild pain and discharge to her left eye for 2 days. Pt bought OTC [facility] "irritated eye drops: containing belladonna, and euphrasia which she has been taking with little improvement. She notes after using these drops she felt light headed and had an episode of sharp CP which has resolved at this time. She also bought Murine plus for dry eyes (containing tetrahydroline hydrochloride) which provided moderate relief. 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    Past Medical History  Diagnosis Date  . Hypertension    Past Surgical History  Procedure Laterality Date  . Tubal ligation     No family history on file. History  Substance Use Topics  . Smoking status: Never Smoker   . Smokeless tobacco: Not on file  . Alcohol Use: No   OB History    No data available     Review of Systems  Constitutional: Negative for fever and diaphoresis.  Eyes: Positive for pain, discharge, redness and itching. Negative for visual disturbance.  Respiratory: Negative for shortness of breath.   Cardiovascular: Positive for chest pain.  Gastrointestinal: Negative for nausea.  Neurological: Positive for light-headedness.  All other systems reviewed and are negative.     Allergies  Review of patient's allergies indicates no known allergies.  Home Medications   Prior to Admission medications   Medication Sig Start Date End Date Taking? Authorizing Provider   hydrochlorothiazide (HYDRODIURIL) 25 MG tablet Take 25 mg by mouth daily as needed. Whenever blood pressure is high    Historical Provider, MD  HYDROcodone-acetaminophen (NORCO) 5-325 MG per tablet Take 1 tablet by mouth every 6 (six) hours as needed for pain. 12/11/12   Doug SouSam Jacubowitz, MD  metoCLOPramide (REGLAN) 10 MG tablet Take 1 tablet (10 mg total) by mouth every 6 (six) hours as needed (nausea/headache). 12/11/12   Doug SouSam Jacubowitz, MD   BP 149/87 mmHg  Pulse 65  Temp(Src) 98.5 F (36.9 C) (Oral)  Resp 18  SpO2 99%  LMP 10/01/2014 Physical Exam  Constitutional: She is oriented to person, place, and time. She appears well-developed and well-nourished.  HENT:  Head: Normocephalic and atraumatic.  Right Ear: External ear normal.  Left Ear: External ear normal.  Nose: Nose normal.  Eyes: EOM are normal. Pupils are equal, round, and reactive to light. Right eye exhibits no discharge. Left eye exhibits no discharge.  Left eye with diffuse conjunctival injection no discharge periorbital swelling   Neck: Neck supple.  Cardiovascular: Normal rate, regular rhythm and normal heart sounds.   Pulmonary/Chest: Effort normal and breath sounds normal.  Abdominal: Soft. She exhibits no distension. There is no tenderness.  Neurological: She is alert and oriented to person, place, and time.  Skin: Skin is warm and dry.  Nursing note and vitals reviewed.   ED Course  Procedures   DIAGNOSTIC STUDIES:  Oxygen Saturation is 99% on RA, normal by  my interpretation.    COORDINATION OF CARE:  2:45 AM Discussed treatment plan with pt at bedside and pt agreed to plan.  Labs Review Labs Reviewed  BASIC METABOLIC PANEL - Abnormal; Notable for the following:    Glucose, Bld 128 (*)    All other components within normal limits  CBC WITH DIFFERENTIAL/PLATELET - Abnormal; Notable for the following:    RBC 3.79 (*)    Hemoglobin 11.2 (*)    HCT 33.6 (*)    All other components within normal limits   I-STAT TROPOININ, ED    Imaging Review No results found.  ED ECG REPORT   Date: 10/10/2014  Rate: 62  Rhythm: normal sinus rhythm  QRS Axis: left  Intervals: normal  ST/T Wave abnormalities: borderline T wave  Conduction Disutrbances:none  Narrative Interpretation:   Old EKG Reviewed: unchanged  I have personally reviewed the EKG tracing and agree with the computerized printout as noted.   MDM   Final diagnoses:  Conjunctivitis, left eye  Dizziness    Patient's left eye appears to be uncomplicated conjunctivitis. No vision change/loss. Patient does not use contacts. No evidence of periorbital infection. Her dizziness seems to be mostly related to using the eyedrops she has a normal physical exam. She complained of very brief atypical chest pain while having dizziness but has a benign ECG and negative troponin. Lab work shows anemia but this is not significant change from baseline. Will treat with erythromycin ointment and discharge home to follow-up closely with PCP.  I personally performed the services described in this documentation, which was scribed in my presence. The recorded information has been reviewed and is accurate.    Pricilla LovelessScott Grainger Mccarley, MD 10/10/14 [phone]

## 2014-10-10 NOTE — ED Notes (Signed)
Pt c/o eye irritation to left eye starting yesterday. 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 States she went to the pharmacy yesterday and used eye drops for pink eye to left eye at 2000 last night. States that she woke up feeling dizzy and lightheaded. C/o mucous from left eye.

## 2014-10-10 NOTE — ED Notes (Signed)
Pt seen here this am for left eye irritation, treated for pink eye. Was given prescription for eye drops. Pt started them, has headache and facial pressure, feels "like a blood vessel is going to burst." also reports feeling lightheaded. No acute distress noted at triage.

## 2014-10-10 NOTE — ED Provider Notes (Signed)
CSN: [id number]     Arrival date & time 10/10/14  1222 History  This chart was scribed for Debbie BleacherJosh Reilynn Lauro, PA-C working with Doug SouSam Jacubowitz, MD by Elveria Risingimelie Horne, ED Scribe. This patient was seen in room TR04C/TR04C and the patient's care was started at 3:40 PM.   Chief Complaint  Patient presents with  . Eye Problem   The history is provided by the patient. No language interpreter was used.   HPI Comments: Debbie Davidson is a 44 y.o. female with PMHx Hypertension who presents to the Emergency Department complaining of worsening left eye pain, redness, and foreign body sensation with initial onset two days ago. Patient recommended "pink eye relief" OTC drops by her pharmacist; patient experienced no relief with treatment. Patient evaluated in ED at 2:40am this morning for her symptoms. Patient diagnosed with conjunctivitis and discharged with erythromycin ophthalmic ointment. Patient reports that since starting the antibiotic she has development of left facial tingling and left headache described as pressure. Patient reports treatment with ibuprofen for her pain at home. Patient denies any improvement in her pain and irritation. Patient does not wear contacts lens. Patient denies direct injury, trauma or presence of foreign body. Patient denies history of diabetes or additional medical issues including autoimmune disease. Patient does not see an eye specialist. Patient states that she has not seen an optometrist in several years.   Past Medical History  Diagnosis Date  . Hypertension    Past Surgical History  Procedure Laterality Date  . Tubal ligation     History reviewed. No pertinent family history. History  Substance Use Topics  . Smoking status: Never Smoker   . Smokeless tobacco: Not on file  . Alcohol Use: No   OB History    No data available     Review of Systems  Constitutional: Negative for fever.  Eyes: Positive for photophobia, pain and redness. Negative for discharge, itching  and visual disturbance.  Respiratory: Negative for cough.   Gastrointestinal: Negative for nausea and vomiting.  Skin: Negative for rash.  Neurological: Positive for headaches.    Allergies  Review of patient's allergies indicates no known allergies.  Home Medications   Prior to Admission medications   Medication Sig Start Date End Date Taking? Authorizing Provider  erythromycin ophthalmic ointment Place a 1/2 inch ribbon of ointment into the lower eyelid four times per day 10/10/14   Pricilla LovelessScott Goldston, MD  Homeopathic Products ([facility] PINK EYE OP) Place 1 drop into both eyes as needed (for pink eye).    Historical Provider, MD  HYDROcodone-acetaminophen (NORCO) 5-325 MG per tablet Take 1 tablet by mouth every 6 (six) hours as needed for pain. Patient not taking: Reported on 10/10/2014 12/11/12   Doug SouSam Jacubowitz, MD  metoCLOPramide (REGLAN) 10 MG tablet Take 1 tablet (10 mg total) by mouth every 6 (six) hours as needed (nausea/headache). Patient not taking: Reported on 10/10/2014 12/11/12   Doug SouSam Jacubowitz, MD   Triage Vitals: BP 152/84 mmHg  Pulse 69  Temp(Src) 98 F (36.7 C) (Oral)  Resp 17  SpO2 100%  LMP 10/01/2014  Physical Exam  Constitutional: She is oriented to person, place, and time. She appears well-developed and well-nourished. No distress.  HENT:  Head: Normocephalic and atraumatic.  Eyes: EOM are normal. Pupils are equal, round, and reactive to light. Right eye exhibits no chemosis, no discharge, no exudate and no hordeolum. No foreign body present in the right eye. Left eye exhibits chemosis. Left eye exhibits no discharge, no exudate  and no hordeolum. No foreign body present in the left eye. Right conjunctiva is not injected. Right conjunctiva has no hemorrhage. Left conjunctiva is injected. Left conjunctiva has a hemorrhage. Right eye exhibits normal extraocular motion. Left eye exhibits normal extraocular motion.  Slit lamp exam:      The left eye shows no corneal abrasion, no  corneal flare, no corneal ulcer, no foreign body, no hyphema, no fluorescein uptake and no anterior chamber bulge.  Entire sclera injected with hemorrhage. No photophobia. No pain with light shined into R eye.   Neck: Normal range of motion. Neck supple. No tracheal deviation present.  Cardiovascular: Normal rate.   Pulmonary/Chest: Effort normal. No respiratory distress.  Musculoskeletal: Normal range of motion.  Neurological: She is alert and oriented to person, place, and time.  Skin: Skin is warm and dry.  Psychiatric: She has a normal mood and affect. Her behavior is normal.  Nursing note and vitals reviewed.   ED Course  Procedures (including critical care time)  COORDINATION OF CARE: 3:59 PM- Plans to consult ophthalmologist for follow up evaluation. Discussed treatment plan with patient at bedside and patient agreed to plan.   Labs Review Labs Reviewed - No data to display  Imaging Review No results found.   EKG Interpretation None       4:25 PM Patient seen and examined. Discussed with Dr. Ethelda ChickJacubowitz who saw patient at arrival. Exam performed. Spoke with Dr. Alvino Chapelhoi of opthalamology who agrees to see patient in his office today.   Vital signs reviewed and are as follows: BP 152/84 mmHg  Pulse 69  Temp(Src) 98 F (36.7 C) (Oral)  Resp 17  SpO2 100%  LMP 10/01/2014   Two drops of tetracaine/proparacaine instilled into affected eye.   Fluorescein strip applied to affected eye. Slit lamp used to assess for corneal abrasion. No corneal abrasion or other lesions identified. No foreign bodies noted. No visible hyphema.   Tonometry performed. Left eye pressure: 24,18  Patient tolerated procedure well without immediate complication.     Visual Acuity  Right Eye Distance: 20/25 Left Eye Distance: 20/40 Bilateral Distance: 20/30  Right Eye Near:   Left Eye Near:    Bilateral Near:       Rx pain meds for home. Patient counseled on use of narcotic pain  medications. Counseled not to combine these medications with others containing tylenol. Urged not to drink alcohol, drive, or perform any other activities that requires focus while taking these medications. The patient verbalizes understanding and agrees with the plan.   MDM   Final diagnoses:  Eye pain, left   Exam as above, pt discharged for immediate follow-up at eye specialist.   I personally performed the services described in this documentation, which was scribed in my presence. The recorded information has been reviewed and is accurate.    Renne CriglerJoshua Roslyn Else, PA-C 10/10/14 1627  Doug SouSam Jacubowitz, MD 10/12/14 [phone]

## 2014-10-10 NOTE — Discharge Instructions (Signed)
Please read and follow all provided instructions.  Your diagnoses today include:  1. Eye pain, left    Tests performed today include:  Visual acuity testing to check your vision  Fluorescein dye examination to look for scratches on your eye  Tonometry to check the pressure inside of your eye  Vital signs. See below for your results today.   Medications prescribed:   Vicodin (hydrocodone/acetaminophen) - narcotic pain medication  DO NOT drive or perform any activities that require you to be awake and alert because this medicine can make you drowsy. BE VERY CAREFUL not to take multiple medicines containing Tylenol (also called acetaminophen). Doing so can lead to an overdose which can damage your liver and cause liver failure and possibly death.  Take any prescribed medications only as directed.  Home care instructions:  Follow any educational materials contained in this packet. If you wear contact lenses, do not use them until your eye caregiver approves. Follow-up care is necessary to be sure the infection is healing if not completely resolved in 2-3 days. See your caregiver or eye specialist as suggested for followup.   If you have an eye infection, wash your hands often as this is very contagious and is easily spread from person to person.   Follow-up instructions: Go to eye specialists office right now.   Return instructions:   Please return to the Emergency Department if you experience worsening symptoms.   Please return immediately if you develop severe pain, pus drainage, new change in vision, or fever.  Please return if you have any other emergent concerns.  Additional Information:  Your vital signs today were: BP 152/84 mmHg   Pulse 69   Temp(Src) 98 F (36.7 C) (Oral)   Resp 17   SpO2 100%   LMP 10/01/2014 If your blood pressure (BP) was elevated above 135/85 this visit, please have this repeated by your doctor within one month. ---------------

## 2014-10-10 NOTE — ED Provider Notes (Signed)
Patient complains of left eye redness and pain "feels like sand is in my eye" onset 36 hours ago gradually. She also complains of tingling on the left side of her face and and pain on left side of face seen here earlier this morning. Prescribed erythromycin eye ointment. She's taken 2 doses, without relief. Other associated symptoms include mild nausea. No fever. No other associated symptoms. She reports she awakened with crusting on eyelashes of left eye 2 days ago. No other associated symptoms On exam alert nontoxic no distress. HEENT exam no facial asymmetry sclera to left eye grossly reddened. No pain on extraocular movement. Pupils equal round react to light. Neck supple no adenopathy neurologic Glasgow Coma Score 15 cranial nerves II through XII grossly intact. Gait normal Romberg normal  Doug SouSam Waldo Damian, MD 10/10/14 1515

## 2015-10-16 ENCOUNTER — Emergency Department (HOSPITAL_COMMUNITY)
Admission: EM | Admit: 2015-10-16 | Discharge: 2015-10-16 | Disposition: A | Discharge status: Home / Self Care | Payer: BC Managed Care – PPO | Attending: Emergency Medicine | Admitting: Emergency Medicine

## 2015-10-16 ENCOUNTER — Encounter (HOSPITAL_COMMUNITY): Payer: Self-pay | Admitting: Emergency Medicine

## 2015-10-16 ENCOUNTER — Emergency Department (HOSPITAL_COMMUNITY): Payer: BC Managed Care – PPO

## 2015-10-16 DIAGNOSIS — Z79899 Other long term (current) drug therapy: Secondary | ICD-10-CM | POA: Diagnosis not present

## 2015-10-16 DIAGNOSIS — R51 Headache: Secondary | ICD-10-CM | POA: Insufficient documentation

## 2015-10-16 DIAGNOSIS — I10 Essential (primary) hypertension: Secondary | ICD-10-CM | POA: Diagnosis not present

## 2015-10-16 DIAGNOSIS — R519 Headache, unspecified: Secondary | ICD-10-CM

## 2015-10-16 DIAGNOSIS — R079 Chest pain, unspecified: Secondary | ICD-10-CM | POA: Diagnosis present

## 2015-10-16 DIAGNOSIS — R0789 Other chest pain: Secondary | ICD-10-CM | POA: Insufficient documentation

## 2015-10-16 DIAGNOSIS — R112 Nausea with vomiting, unspecified: Secondary | ICD-10-CM | POA: Diagnosis not present

## 2015-10-16 LAB — BASIC METABOLIC PANEL
Anion gap: 11 (ref 5–15)
BUN: 12 mg/dL (ref 6–20)
CHLORIDE: 103 mmol/L (ref 101–111)
CO2: 24 mmol/L (ref 22–32)
Calcium: 9 mg/dL (ref 8.9–10.3)
Creatinine, Ser: 0.9 mg/dL (ref 0.44–1.00)
GFR calc Af Amer: >60 mL/min (ref >60)
GFR calc non Af Amer: >60 mL/min (ref >60)
Glucose, Bld: 119 mg/dL — ABNORMAL HIGH (ref 65–99)
POTASSIUM: 4 mmol/L (ref 3.5–5.1)
SODIUM: 138 mmol/L (ref 135–145)

## 2015-10-16 LAB — I-STAT TROPONIN, ED
Troponin i, poc: 0 ng/mL (ref 0.00–0.08)
Troponin i, poc: 0 ng/mL (ref 0.00–0.08)

## 2015-10-16 LAB — CBC
HEMATOCRIT: 37.3 % (ref 36.0–46.0)
Hemoglobin: 11.9 g/dL — ABNORMAL LOW (ref 12.0–15.0)
MCH: 27.5 pg (ref 26.0–34.0)
MCHC: 31.9 g/dL (ref 30.0–36.0)
MCV: 86.3 fL (ref 78.0–100.0)
Platelets: 298 10*3/uL (ref 150–400)
RBC: 4.32 MIL/uL (ref 3.87–5.11)
RDW: 13.5 % (ref 11.5–15.5)
WBC: 5.8 10*3/uL (ref 4.0–10.5)

## 2015-10-16 MED ORDER — METOPROLOL TARTRATE 25 MG PO TABS
50.0000 mg | ORAL_TABLET | Freq: Once | ORAL | Status: DC
  Filled 2015-10-16: qty 2

## 2015-10-16 MED ORDER — ONDANSETRON HCL 4 MG PO TABS: 4.0000 mg | ORAL_TABLET | Freq: Four times a day (QID) | ORAL | Status: DC

## 2015-10-16 MED ORDER — KETOROLAC TROMETHAMINE 30 MG/ML IJ SOLN
30.0000 mg | Freq: Once | INTRAMUSCULAR | Status: AC
  Administered 2015-10-16: 30 mg via INTRAVENOUS
  Filled 2015-10-16: qty 1

## 2015-10-16 MED ORDER — METOCLOPRAMIDE HCL 5 MG/ML IJ SOLN
5.0000 mg | Freq: Once | INTRAMUSCULAR | Status: AC
  Administered 2015-10-16: 5 mg via INTRAVENOUS
  Filled 2015-10-16: qty 2

## 2015-10-16 MED ORDER — ASPIRIN 81 MG PO CHEW
324.0000 mg | CHEWABLE_TABLET | Freq: Once | ORAL | Status: AC
  Administered 2015-10-16: 324 mg via ORAL
  Filled 2015-10-16: qty 4

## 2015-10-16 MED ORDER — DIPHENHYDRAMINE HCL 50 MG/ML IJ SOLN
12.5000 mg | Freq: Once | INTRAMUSCULAR | Status: AC
  Administered 2015-10-16: 12.5 mg via INTRAVENOUS
  Filled 2015-10-16: qty 1

## 2015-10-16 MED ORDER — HYDROCHLOROTHIAZIDE 25 MG PO TABS
25.0000 mg | ORAL_TABLET | Freq: Once | ORAL | Status: DC
  Filled 2015-10-16: qty 1

## 2015-10-16 MED ORDER — ONDANSETRON HCL 4 MG/2ML IJ SOLN
4.0000 mg | Freq: Once | INTRAMUSCULAR | Status: AC
  Administered 2015-10-16: 4 mg via INTRAVENOUS
  Filled 2015-10-16: qty 2

## 2015-10-16 NOTE — ED Provider Notes (Signed)
CSN: [id number]     Arrival date & time 10/16/15  0807 History   First MD Initiated Contact with Patient 10/16/15 [phone]     Chief Complaint  Patient presents with  . Chest Pain     (Consider location/radiation/quality/duration/timing/severity/associated sxs/prior Treatment) HPI Debbie Davidson is a 45 y.o. female with PMH significant for HTN currently not taking any medications for the past 1.5 years who presents with chest pain and headache.  She states she was on HCTZ and Metoprolol in the past, but reports she told her PCP, Dr. Bruna Potter, that she was stopping the medications because she was controlling her BP through diet and exercise. Patient reports she awoke from sleep approximately 5 AM with a frontal, throbbing, constant headache with associated dizziness, lightheadedness, nausea and vomiting.  She also describes right sided, non-radiating, constant chest pain she describes as tightening.  Associated symptoms of SOB.  No modifying factors.  No personal cardiac history.  No family history of SCD.  Denies tobacco or drug use.  Occasional drinker.  No hx of DVT/PE, unilateral leg swelling, exogenous estrogen, or recent trauma/surgery. Denies fever, chills, unilateral weakness, slurred speech, numbness, vertigo, diplopia, diarrhea, abdominal pain, or urinary symptoms.   Past Medical History  Diagnosis Date  . Hypertension    Past Surgical History  Procedure Laterality Date  . Tubal ligation     History reviewed. No pertinent family history. Social History  Substance Use Topics  . Smoking status: Never Smoker   . Smokeless tobacco: None  . Alcohol Use: No   OB History    No data available     Review of Systems All other systems negative unless otherwise stated in HPI    Allergies  Review of patient's allergies indicates no known allergies.  Home Medications   Prior to Admission medications   Medication Sig Start Date End Date Taking? Authorizing Provider  aspirin-sod  bicarb-citric acid (ALKA-SELTZER) 325 MG TBEF tablet Take 325 mg by mouth every 6 (six) hours as needed (cold sx).   Yes Historical Provider, MD  Multiple Vitamin (MULTIVITAMIN) tablet Take 1 tablet by mouth daily.   Yes Historical Provider, MD  erythromycin ophthalmic ointment Place a 1/2 inch ribbon of ointment into the lower eyelid four times per day Patient not taking: Reported on 10/16/2015 10/10/14   Pricilla Loveless, MD  HYDROcodone-acetaminophen (NORCO/VICODIN) 5-325 MG per tablet Take 1-2 tablets every 6 hours as needed for severe pain Patient not taking: Reported on 10/16/2015 10/10/14   Renne Crigler, PA-C   BP 107/70 mmHg  Pulse 59  Temp(Src) 98.1 F (36.7 C) (Oral)  Resp 14  SpO2 97%  LMP 10/05/2015 Physical Exam  Constitutional: She is oriented to person, place, and time. She appears well-developed and well-nourished.  Non-toxic appearance. She does not have a sickly appearance. She does not appear ill.  Patient vomiting during history.   HENT:  Head: Normocephalic and atraumatic.  Mouth/Throat: Oropharynx is clear and moist.  Eyes: Conjunctivae are normal. Pupils are equal, round, and reactive to light.  Neck: Normal range of motion. Neck supple.  Cardiovascular: Normal rate and regular rhythm.   Pulmonary/Chest: Effort normal and breath sounds normal. No accessory muscle usage or stridor. No respiratory distress. She has no wheezes. She has no rhonchi. She has no rales.  Abdominal: Soft. Bowel sounds are normal. She exhibits no distension. There is no tenderness.  Musculoskeletal: Normal range of motion.  Lymphadenopathy:    She has no cervical adenopathy.  Neurological: She is  alert and oriented to person, place, and time.  Mental Status:   AOx3.  Speech clear without dysarthria. Cranial Nerves:  I-not tested  II-PERRLA  III, IV, VI-EOMs intact  V-temporal and masseter strength intact  VII-symmetrical facial movements intact, no facial droop  VIII-hearing grossly intact  bilaterally  IX, X-gag intact  XI-strength of sternomastoid and trapezius muscles 5/5  XII-tongue midline Motor:   Good muscle bulk and tone  Strength 5/5 bilaterally in upper and lower extremities   Cerebellar--intact RAMs, finger to nose intact bilaterally.  Gait normal  No pronator drift Sensory:  Intact in upper and lower extremities   Skin: Skin is warm and dry.  Psychiatric: She has a normal mood and affect. Her behavior is normal.    ED Course  Procedures (including critical care time) Labs Review Labs Reviewed  BASIC METABOLIC PANEL - Abnormal; Notable for the following:    Glucose, Bld 119 (*)    All other components within normal limits  CBC - Abnormal; Notable for the following:    Hemoglobin 11.9 (*)    All other components within normal limits  I-STAT TROPOININ, ED  Rosezena SensorI-STAT TROPOININ, ED    Imaging Review Dg Chest 2 View  10/16/2015  CLINICAL DATA:  Chest pain. EXAM: CHEST - 2 VIEW COMPARISON:  Acute abdominal series 08/01/2012. FINDINGS: The heart size is exaggerated by low lung volumes. No focal airspace disease is present. The visualized soft tissues and bony thorax are unremarkable. IMPRESSION: 1. Low lung volumes. 2. No acute cardiopulmonary disease. Electronically Signed   By: Marin Robertshristopher  Mattern M.D.   On: 10/16/2015 09:23   Ct Head Wo Contrast  10/16/2015  CLINICAL DATA:  Headaches and hypertension EXAM: CT HEAD WITHOUT CONTRAST TECHNIQUE: Contiguous axial images were obtained from the base of the skull through the vertex without intravenous contrast. COMPARISON:  11/17/2011 FINDINGS: The bony calvarium is intact. The ventricles are of normal size and configuration. No findings to suggest acute hemorrhage, acute infarction or space-occupying mass lesion are noted. IMPRESSION: No acute intracranial abnormality noted. Electronically Signed   By: Alcide CleverMark  Lukens M.D.   On: 10/16/2015 10:01   I have personally reviewed and evaluated these images and lab results as  part of my medical decision-making.   EKG Interpretation   Date/Time:  Friday Oct 16 2015 08:25:00 EDT Ventricular Rate:  66 PR Interval:  192 QRS Duration: 76 QT Interval:  396 QTC Calculation: 415 R Axis:   54 Text Interpretation:  Normal sinus rhythm with sinus arrhythmia Normal ECG  Confirmed by Rubin PayorPICKERING  MD, Harrold DonathNATHAN [phone]) on 10/16/2015 8:55:50 AM Also  confirmed by Rubin PayorPICKERING  MD, NATHAN [phone]), editor BREWER, CCT, GLENN  [phone])  on 10/16/2015 11:07:19 AM      MDM   Final diagnoses:  Nonintractable headache, unspecified chronicity pattern, unspecified headache type  Non-intractable vomiting with nausea, vomiting of unspecified type  Atypical chest pain   Patient presents with headache, chest pain, and elevated BP.  No currently on medications.  Hx of HTN.  Previous meds, hctz and metoprolol.  No cardiac history.  On exam, patient is actively vomiting.  BP 176/112, HR 76, RR 22 and 98% on RA.  Heart RRR, lungs CTAB, abdomen soft and benign.  Normal neuro exam.  DDx includes hypertensive urgency/emergency, SAH, PE, ACS. Will give ASA, Zofran, HCTZ, and Metoprolol.   PERC negative.  Labs without acute abnormalities.  Troponin 0.00.  EKG without acute changes.  CXR negative.  CT head negative.  HCTZ  and Metoprolol held due to improving BP.  BP now 134/61.  Upon reassessment, nausea somewhat improved, continues with slight headache.  Will give reglan, benadryl, and toradol.   Delta troponin 0.00.  Improvement of symptoms.  She continues to remain normotensive.  Able to tolerate PO intake.  Follow up PCP.  Discharge home with zofran.  Discussed return precautions.  Patient agrees and acknowledges the above plan for discharge  Case has been discussed with Dr. Rubin Payor who agrees with the above plan for discharge.       Cheri Fowler, PA-C 10/16/15 1253  Benjiman Core, MD 10/16/15 [phone]

## 2015-10-16 NOTE — ED Notes (Signed)
Rose, PA at bedside

## 2015-10-16 NOTE — Discharge Instructions (Signed)
Your CT scan, chest x-ray, EKG, and lab work today were normal.  Please take Zofran every 6 hours as needed for nausea and vomiting.  Follow up with your primary care doctor in the next 2 days.    Migraine Headache A migraine headache is an intense, throbbing pain on one or both sides of your head. A migraine can last for 30 minutes to several hours. CAUSES  The exact cause of a migraine headache is not always known. However, a migraine may be caused when nerves in the brain become irritated and release chemicals that cause inflammation. This causes pain. Certain things may also trigger migraines, such as:  Alcohol.  Smoking.  Stress.  Menstruation.  Aged cheeses.  Foods or drinks that contain nitrates, glutamate, aspartame, or tyramine.  Lack of sleep.  Chocolate.  Caffeine.  Hunger.  Physical exertion.  Fatigue.  Medicines used to treat chest pain (nitroglycerine), birth control pills, estrogen, and some blood pressure medicines. SIGNS AND SYMPTOMS  Pain on one or both sides of your head.  Pulsating or throbbing pain.  Severe pain that prevents daily activities.  Pain that is aggravated by any physical activity.  Nausea, vomiting, or both.  Dizziness.  Pain with exposure to bright lights, loud noises, or activity.  General sensitivity to bright lights, loud noises, or smells. Before you get a migraine, you may get warning signs that a migraine is coming (aura). An aura may include:  Seeing flashing lights.  Seeing bright spots, halos, or zigzag lines.  Having tunnel vision or blurred vision.  Having feelings of numbness or tingling.  Having trouble talking.  Having muscle weakness. DIAGNOSIS  A migraine headache is often diagnosed based on:  Symptoms.  Physical exam.  A CT scan or MRI of your head. These imaging tests cannot diagnose migraines, but they can help rule out other causes of headaches. TREATMENT Medicines may be given for pain and  nausea. Medicines can also be given to help prevent recurrent migraines.  HOME CARE INSTRUCTIONS  Only take over-the-counter or prescription medicines for pain or discomfort as directed by your health care provider. The use of long-term narcotics is not recommended.  Lie down in a dark, quiet room when you have a migraine.  Keep a journal to find out what may trigger your migraine headaches. For example, write down:  What you eat and drink.  How much sleep you get.  Any change to your diet or medicines.  Limit alcohol consumption.  Quit smoking if you smoke.  Get 7-9 hours of sleep, or as recommended by your health care provider.  Limit stress.  Keep lights dim if bright lights bother you and make your migraines worse. SEEK IMMEDIATE MEDICAL CARE IF:   Your migraine becomes severe.  You have a fever.  You have a stiff neck.  You have vision loss.  You have muscular weakness or loss of muscle control.  You start losing your balance or have trouble walking.  You feel faint or pass out.  You have severe symptoms that are different from your first symptoms. MAKE SURE YOU:   Understand these instructions.  Will watch your condition.  Will get help right away if you are not doing well or get worse.   This information is not intended to replace advice given to you by your health care provider. Make sure you discuss any questions you have with your health care provider.   Document Released: 05/23/2005 Document Revised: 06/13/2014 Document Reviewed: 01/28/2013 Elsevier  Interactive Patient Education ©2016 [employer] Inc. ° °

## 2015-10-16 NOTE — ED Notes (Addendum)
States woke up this morning and felt "off". Pain in right side and headache. Chest pain. Non radiating; feels very weak. Gait is normal; neuro intact; vomiting x 3

## 2015-10-16 NOTE — ED Notes (Signed)
Pt tolerating PO intake

## 2015-10-16 NOTE — ED Notes (Signed)
Pt transported to xray 

## 2016-09-13 ENCOUNTER — Encounter (HOSPITAL_COMMUNITY): Payer: Self-pay

## 2016-09-13 ENCOUNTER — Emergency Department (HOSPITAL_COMMUNITY): Payer: BC Managed Care – PPO

## 2016-09-13 ENCOUNTER — Emergency Department (HOSPITAL_COMMUNITY)
Admission: EM | Admit: 2016-09-13 | Discharge: 2016-09-14 | Disposition: A | Discharge status: Home / Self Care | Payer: BC Managed Care – PPO | Attending: Emergency Medicine | Admitting: Emergency Medicine

## 2016-09-13 DIAGNOSIS — M79605 Pain in left leg: Secondary | ICD-10-CM

## 2016-09-13 DIAGNOSIS — I10 Essential (primary) hypertension: Secondary | ICD-10-CM | POA: Insufficient documentation

## 2016-09-13 DIAGNOSIS — Z7982 Long term (current) use of aspirin: Secondary | ICD-10-CM | POA: Insufficient documentation

## 2016-09-13 DIAGNOSIS — Z79899 Other long term (current) drug therapy: Secondary | ICD-10-CM | POA: Insufficient documentation

## 2016-09-13 MED ORDER — HYDROCODONE-ACETAMINOPHEN 5-325 MG PO TABS
1.0000 | ORAL_TABLET | Freq: Once | ORAL | Status: AC
  Administered 2016-09-14: 1 via ORAL
  Filled 2016-09-13: qty 1

## 2016-09-13 NOTE — ED Provider Notes (Signed)
Medical screening examination/treatment/procedure(s) were conducted as a shared visit with non-physician practitioner(s) and myself.  I personally evaluated the patient during the encounter.  Acute onset of left calf pain after a pop earlier today.  On my exam, normal dp pulse. ttp in calf. Thompson test normal.  Possibly bakers cyst vs achilles tear.  Plan for pain control/brace/crutches.   Marily Memos, MD 09/14/16 2222

## 2016-09-13 NOTE — ED Provider Notes (Signed)
[facility]-EMERGENCY DEPT Provider Note   CSN: [id number] Arrival date & time: 09/13/16  2105   By signing my name below, I, Teofilo Pod, attest that this documentation has been prepared under the direction and in the presence of Kerrie Buffalo, NP. Electronically Signed: Teofilo Pod, ED Scribe. 09/13/2016. 11:07 PM.   History   Chief Complaint No chief complaint on file.  The history is provided by the patient. No language interpreter was used.   HPI Comments:  Debbie Davidson is a 46 y.o. female who presents to the Emergency Department complaining of constant left lower leg pain that began PTA. Pt reports that she was getting in to bed and heard a snap in her left lower leg at her calf and ankle. She rates the pain at 10/10, and states that she is unable to walk. No alleviating factors noted. Pt denies other associated symptoms. She has taken nothing for pain.  Past Medical History:  Diagnosis Date  . Hypertension     Patient Active Problem List   Diagnosis Date Noted  . Other and unspecified noninfectious gastroenteritis and colitis(558.9) 08/03/2012  . Hypokalemia 08/03/2012  . Abdominal pain, acute, epigastric 08/02/2012  . Nausea & vomiting 08/02/2012  . Hypertension     Past Surgical History:  Procedure Laterality Date  . TUBAL LIGATION      OB History    No data available       Home Medications    Prior to Admission medications   Medication Sig Start Date End Date Taking? Authorizing Provider  aspirin-sod bicarb-citric acid (ALKA-SELTZER) 325 MG TBEF tablet Take 325 mg by mouth every 6 (six) hours as needed (cold sx).    Historical Provider, MD  cyclobenzaprine (FLEXERIL) 10 MG tablet Take 1 tablet (10 mg total) by mouth 2 (two) times daily as needed (muscle pain). 09/14/16   Hope Orlene Och, NP  diclofenac (VOLTAREN) 50 MG EC tablet Take 1 tablet (50 mg total) by mouth 2 (two) times daily. 09/14/16   Hope Orlene Och, NP  HYDROcodone-acetaminophen (NORCO) 5-325  MG tablet Take 1 tablet by mouth every 6 (six) hours as needed. 09/14/16   Hope Orlene Och, NP  Multiple Vitamin (MULTIVITAMIN) tablet Take 1 tablet by mouth daily.    Historical Provider, MD  ondansetron (ZOFRAN) 4 MG tablet Take 1 tablet (4 mg total) by mouth every 6 (six) hours. 10/16/15   Cheri Fowler, PA-C    Family History No family history on file.  Social History Social History  Substance Use Topics  . Smoking status: Never Smoker  . Smokeless tobacco: Not on file  . Alcohol use No     Allergies   Patient has no known allergies.   Review of Systems Review of Systems  Constitutional: Negative for fever.  Cardiovascular: Negative for leg swelling.  Gastrointestinal: Negative for nausea and vomiting.  Musculoskeletal: Positive for arthralgias.       Left lower leg pain  Skin: Negative for wound.     Physical Exam Updated Vital Signs BP (!) 154/99 (BP Location: Right Arm)   Pulse 73   Temp 98.4 F (36.9 C) (Oral)   Resp 16   LMP 08/23/2016 (Approximate)   SpO2 98%   Physical Exam  Constitutional: She appears well-developed and well-nourished. No distress.  HENT:  Head: Normocephalic and atraumatic.  Eyes: Conjunctivae are normal.  Cardiovascular: Normal rate.   DP pulses 2+, adequate circulation.   Pulmonary/Chest: Effort normal.  Abdominal: She exhibits no distension.  Musculoskeletal:       Left ankle: She exhibits no ecchymosis and normal pulse. Swelling: mild. Tenderness. Achilles tendon exhibits pain. Achilles tendon exhibits no defect and normal Thompson's test results.  Pain to the achilles tendon that radiates to the left calf.  Neurological: She is alert.  Skin: Skin is warm and dry.  Psychiatric: She has a normal mood and affect.  Nursing note and vitals reviewed.    ED Treatments / Results  DIAGNOSTIC STUDIES:  Oxygen Saturation is 99% on RA, normal by my interpretation.    COORDINATION OF CARE:  11:07 PM Discussed treatment plan with pt at  bedside and pt agreed to plan.   Labs (all labs ordered are listed, but only abnormal results are displayed) Labs Reviewed - No data to display  Radiology Dg Ankle Complete Left  Result Date: 09/13/2016 CLINICAL DATA:  Heard snap in left Achilles while walking. Severe left ankle pain, acute onset. Initial encounter. EXAM: LEFT ANKLE COMPLETE - 3+ VIEW COMPARISON:  None. FINDINGS: There is no evidence of fracture or dislocation. The ankle mortise is intact; the interosseous space is within normal limits. No talar tilt or subluxation is seen. A plantar calcaneal spur is seen, with a tiny adjacent degenerative osseous fragment. The joint spaces are preserved. The visualized portions of the Achilles tendon are grossly unremarkable, though evaluation of soft tissue structures is limited on radiograph. IMPRESSION: 1. No evidence of fracture or dislocation. 2. Visualized portions of the Achilles tendon are grossly unremarkable, though evaluation of soft tissue structures is limited on radiograph. If the patient's symptoms persist and if further imaging evaluation is deemed clinically appropriate, MRI of the left ankle could be considered to assess for internal derangement. Electronically Signed   By: Roanna Raider M.D.   On: 09/13/2016 23:41    Procedures Discussed with Dr. Clayborne Dana and he examined the patient. X-ray, cam walker, crutches, elevation and f/u with ortho.  Procedures (including critical care time)  Medications Ordered in ED Medications  HYDROcodone-acetaminophen (NORCO/VICODIN) 5-325 MG per tablet 1 tablet (1 tablet Oral Given 09/14/16 0007)  ketorolac (TORADOL) injection 30 mg (30 mg Intramuscular Given 09/14/16 0126)     Initial Impression / Assessment and Plan / ED Course  I have reviewed the triage vital signs and the nursing notes.  Pertinent imaging results that were available during my care of the patient were reviewed by me and considered in my medical decision making (see chart  for details).   Final Clinical Impressions(s) / ED Diagnoses  46 y.o. female with left lower leg pain stable for d/c without focal neuro deficits. Patient placed in cam walker while in the ED, pain managed and discussed need for f/u with ortho. Patient agrees with plan.  Final diagnoses:  Left leg pain    New Prescriptions Discharge Medication List as of 09/14/2016  1:12 AM    START taking these medications   Details  cyclobenzaprine (FLEXERIL) 10 MG tablet Take 1 tablet (10 mg total) by mouth 2 (two) times daily as needed (muscle pain)., Starting Wed 09/14/2016, Print    diclofenac (VOLTAREN) 50 MG EC tablet Take 1 tablet (50 mg total) by mouth 2 (two) times daily., Starting Wed 09/14/2016, Print      I personally performed the services described in this documentation, which was scribed in my presence. The recorded information has been reviewed and is accurate.     [address] [place], NP 09/14/16 0217    Marily Memos, MD 09/14/16 2214  Marily Memos, MD 09/14/16 2221

## 2016-09-13 NOTE — ED Triage Notes (Signed)
Pt states she was walking and heard snap in left calm suddenly; pt states she is unable to apply pressure to leg; pt states pain at 10/10 on arrival. Pt was wheeled to triage room; Pt denies fall; Pt a&ox 4 on arrival.

## 2016-09-14 MED ORDER — KETOROLAC TROMETHAMINE 60 MG/2ML IM SOLN
30.0000 mg | Freq: Once | INTRAMUSCULAR | Status: AC
  Administered 2016-09-14: 30 mg via INTRAMUSCULAR
  Filled 2016-09-14: qty 2

## 2016-09-14 MED ORDER — HYDROCODONE-ACETAMINOPHEN 5-325 MG PO TABS: 1.0000 | ORAL_TABLET | Freq: Four times a day (QID) | ORAL | 0 refills | Status: DC | PRN

## 2016-09-14 MED ORDER — CYCLOBENZAPRINE HCL 10 MG PO TABS: 10.0000 mg | ORAL_TABLET | Freq: Two times a day (BID) | ORAL | 0 refills | Status: DC | PRN

## 2016-09-14 MED ORDER — DICLOFENAC SODIUM 50 MG PO TBEC: 50.0000 mg | DELAYED_RELEASE_TABLET | Freq: Two times a day (BID) | ORAL | 0 refills | Status: DC

## 2016-09-14 NOTE — ED Notes (Signed)
Pt c/o left leg pain. Stated it popped when she went in to bed See providers assessment.

## 2016-09-14 NOTE — ED Notes (Signed)
Pt stable, understands discharge instructions, and reasons for return.   

## 2016-09-14 NOTE — Discharge Instructions (Signed)
Do not drive while taking the medications because they can make you sleepy. Follow up with the orthopedic doctor. Return here as needed.

## 2018-08-04 ENCOUNTER — Emergency Department (HOSPITAL_COMMUNITY): Payer: BC Managed Care – PPO

## 2018-08-04 ENCOUNTER — Emergency Department (HOSPITAL_COMMUNITY)
Admission: EM | Admit: 2018-08-04 | Discharge: 2018-08-04 | Disposition: A | Discharge status: Home / Self Care | Payer: BC Managed Care – PPO | Attending: Emergency Medicine | Admitting: Emergency Medicine

## 2018-08-04 DIAGNOSIS — R112 Nausea with vomiting, unspecified: Secondary | ICD-10-CM

## 2018-08-04 DIAGNOSIS — H538 Other visual disturbances: Secondary | ICD-10-CM | POA: Diagnosis not present

## 2018-08-04 DIAGNOSIS — I10 Essential (primary) hypertension: Secondary | ICD-10-CM | POA: Insufficient documentation

## 2018-08-04 DIAGNOSIS — R0789 Other chest pain: Secondary | ICD-10-CM

## 2018-08-04 DIAGNOSIS — R519 Headache, unspecified: Secondary | ICD-10-CM

## 2018-08-04 DIAGNOSIS — R61 Generalized hyperhidrosis: Secondary | ICD-10-CM | POA: Insufficient documentation

## 2018-08-04 DIAGNOSIS — R1010 Upper abdominal pain, unspecified: Secondary | ICD-10-CM

## 2018-08-04 DIAGNOSIS — R51 Headache: Secondary | ICD-10-CM | POA: Insufficient documentation

## 2018-08-04 DIAGNOSIS — R42 Dizziness and giddiness: Secondary | ICD-10-CM | POA: Diagnosis not present

## 2018-08-04 LAB — URINALYSIS, ROUTINE W REFLEX MICROSCOPIC
BILIRUBIN URINE: NEGATIVE
Glucose, UA: NEGATIVE mg/dL
KETONES UR: NEGATIVE mg/dL
Nitrite: NEGATIVE
PH: 6 (ref 5.0–8.0)
PROTEIN: 30 mg/dL — AB
Specific Gravity, Urine: 1.006 (ref 1.005–1.030)

## 2018-08-04 LAB — CBC WITH DIFFERENTIAL/PLATELET
Abs Immature Granulocytes: 0.02 10*3/uL (ref 0.00–0.07)
Basophils Absolute: 0 10*3/uL (ref 0.0–0.1)
Basophils Relative: 1 %
EOS ABS: 0 10*3/uL (ref 0.0–0.5)
EOS PCT: 1 %
HEMATOCRIT: 35.1 % — AB (ref 36.0–46.0)
HEMOGLOBIN: 11.3 g/dL — AB (ref 12.0–15.0)
IMMATURE GRANULOCYTES: 0 %
LYMPHS ABS: 2.7 10*3/uL (ref 0.7–4.0)
LYMPHS PCT: 40 %
MCH: 28.6 pg (ref 26.0–34.0)
MCHC: 32.2 g/dL (ref 30.0–36.0)
MCV: 88.9 fL (ref 80.0–100.0)
MONOS PCT: 5 %
Monocytes Absolute: 0.3 10*3/uL (ref 0.1–1.0)
Neutro Abs: 3.6 10*3/uL (ref 1.7–7.7)
Neutrophils Relative %: 53 %
Platelets: 285 10*3/uL (ref 150–400)
RBC: 3.95 MIL/uL (ref 3.87–5.11)
RDW: 13.2 % (ref 11.5–15.5)
WBC: 6.6 10*3/uL (ref 4.0–10.5)
nRBC: 0 % (ref 0.0–0.2)

## 2018-08-04 LAB — COMPREHENSIVE METABOLIC PANEL
ALK PHOS: 57 U/L (ref 38–126)
ALT: 19 U/L (ref 0–44)
AST: 24 U/L (ref 15–41)
Albumin: 3.2 g/dL — ABNORMAL LOW (ref 3.5–5.0)
Anion gap: 8 (ref 5–15)
BILIRUBIN TOTAL: 1.3 mg/dL — AB (ref 0.3–1.2)
BUN: 12 mg/dL (ref 6–20)
CALCIUM: 7.9 mg/dL — AB (ref 8.9–10.3)
CO2: 24 mmol/L (ref 22–32)
CREATININE: 0.76 mg/dL (ref 0.44–1.00)
Chloride: 108 mmol/L (ref 98–111)
GFR calc Af Amer: >60 mL/min (ref >60)
GLUCOSE: 101 mg/dL — AB (ref 70–99)
POTASSIUM: 3.6 mmol/L (ref 3.5–5.1)
Sodium: 140 mmol/L (ref 135–145)
TOTAL PROTEIN: 6.4 g/dL — AB (ref 6.5–8.1)

## 2018-08-04 LAB — LIPASE, BLOOD: LIPASE: 43 U/L (ref 11–51)

## 2018-08-04 LAB — MAGNESIUM: Magnesium: 1.9 mg/dL (ref 1.7–2.4)

## 2018-08-04 LAB — I-STAT TROPONIN, ED
Troponin i, poc: 0 ng/mL (ref 0.00–0.08)
Troponin i, poc: 0 ng/mL (ref 0.00–0.08)

## 2018-08-04 LAB — I-STAT BETA HCG BLOOD, ED (MC, WL, AP ONLY)

## 2018-08-04 MED ORDER — SODIUM CHLORIDE 0.9 % IV BOLUS
1000.0000 mL | Freq: Once | INTRAVENOUS | Status: AC
  Administered 2018-08-04: 1000 mL via INTRAVENOUS

## 2018-08-04 MED ORDER — CALCIUM CARBONATE ANTACID 500 MG PO CHEW: 2.0000 | CHEWABLE_TABLET | Freq: Three times a day (TID) | ORAL | 0 refills | Status: DC

## 2018-08-04 MED ORDER — PROMETHAZINE HCL 25 MG PO TABS: 25.0000 mg | ORAL_TABLET | Freq: Four times a day (QID) | ORAL | 0 refills | Status: DC | PRN

## 2018-08-04 MED ORDER — LIDOCAINE VISCOUS HCL 2 % MT SOLN
15.0000 mL | Freq: Once | OROMUCOSAL | Status: AC
  Administered 2018-08-04: 15 mL via ORAL
  Filled 2018-08-04: qty 15

## 2018-08-04 MED ORDER — ONDANSETRON HCL 4 MG/2ML IJ SOLN
4.0000 mg | Freq: Once | INTRAMUSCULAR | Status: AC
  Administered 2018-08-04: 4 mg via INTRAVENOUS
  Filled 2018-08-04: qty 2

## 2018-08-04 MED ORDER — ASPIRIN 81 MG PO CHEW
324.0000 mg | CHEWABLE_TABLET | Freq: Once | ORAL | Status: AC
  Administered 2018-08-04: 324 mg via ORAL
  Filled 2018-08-04: qty 4

## 2018-08-04 MED ORDER — PROMETHAZINE HCL 25 MG/ML IJ SOLN
25.0000 mg | Freq: Once | INTRAMUSCULAR | Status: AC
  Administered 2018-08-04: 25 mg via INTRAVENOUS
  Filled 2018-08-04: qty 1

## 2018-08-04 MED ORDER — MORPHINE SULFATE (PF) 4 MG/ML IV SOLN
4.0000 mg | Freq: Once | INTRAVENOUS | Status: AC
  Administered 2018-08-04: 4 mg via INTRAVENOUS
  Filled 2018-08-04: qty 1

## 2018-08-04 MED ORDER — ALUM & MAG HYDROXIDE-SIMETH 200-200-20 MG/5ML PO SUSP
30.0000 mL | Freq: Once | ORAL | Status: AC
  Administered 2018-08-04: 30 mL via ORAL
  Filled 2018-08-04: qty 30

## 2018-08-04 NOTE — ED Triage Notes (Signed)
Pt form home; c/o sharp central intermittent CP that began around 0400 this am, accompanied by dizziness;  nausea and vomiting that began yesterday; also c/o R sided HA; CP increases when pt bends over; pt took ibuprofen at home w/o relief; endorses diaphoresis when CP first began; pt warm and dry at present

## 2018-08-04 NOTE — ED Provider Notes (Signed)
[facility] EMERGENCY DEPARTMENT Provider Note   CSN: [id number] Arrival date & time: 08/04/18  1342    History   Chief Complaint Chief Complaint  Patient presents with  . Chest Pain  . Dizziness  . Emesis  . Nausea    HPI    Debbie Davidson is a 48 y.o. female with a PMHx of HTN (diet controlled currently), who presents to the ED with complaints of nausea and vomiting that began yesterday.  Patient states that yesterday she was traveling back from her aunt's funeral arrangements about an hour away, when she had to stop several times on the ride home because she was vomiting.  She states that she vomited "all day" as well as 3 times today, all emesis was nonbloody and nonbilious.  This morning she woke up around 4 AM and started having chest pain that she describes as 10/10 initially constant but intermittent over the last 2 hours central/epigastric CP which was sharp, nonradiating, worse with bending down, and unrelieved with ibuprofen and heat use.  She reports feeling lightheaded, getting somewhat diaphoretic, and having upper abdominal pain with it.  She states that she feels like she "vomited so much that everything hurts".  She also started having a right-sided headache with some mild blurred vision from the right side.  She is a non-smoker.  Positive family history of MI in her mother and aunt.  She has not been on blood pressure medications in 3-1/2 years.  She is currently on her menstrual cycle.  Her PCP is at [facility].  She denies any fevers, chills, URI symptoms, cough, shortness of breath, leg swelling, recent prolonged travel, surgery, immobilization, personal or family history of DVT/PE, hematemesis, melena, hematochezia, diarrhea, constipation, dysuria, myalgias, arthralgias, numbness, tingling, focal weakness, claudication, orthopnea, or any other complaints at this time.  She is currently on her menstrual cycle so she is not sure whether she has  hematuria.  The history is provided by the patient and medical records. No language interpreter was used.  Chest Pain  Associated symptoms: abdominal pain, diaphoresis, headache, nausea and vomiting   Associated symptoms: no cough, no fever, no numbness, no shortness of breath and no weakness   Dizziness  Associated symptoms: chest pain, headaches, nausea and vomiting   Associated symptoms: no blood in stool, no diarrhea, no shortness of breath and no weakness   Emesis  Associated symptoms: abdominal pain and headaches   Associated symptoms: no arthralgias, no chills, no cough, no diarrhea, no fever, no myalgias and no sore throat     Past Medical History:  Diagnosis Date  . Hypertension     Patient Active Problem List   Diagnosis Date Noted  . Other and unspecified noninfectious gastroenteritis and colitis(558.9) 08/03/2012  . Hypokalemia 08/03/2012  . Abdominal pain, acute, epigastric 08/02/2012  . Nausea & vomiting 08/02/2012  . Hypertension     Past Surgical History:  Procedure Laterality Date  . TUBAL LIGATION       OB History   No obstetric history on file.      Home Medications    Prior to Admission medications   Medication Sig Start Date End Date Taking? Authorizing Provider  aspirin-sod bicarb-citric acid (ALKA-SELTZER) 325 MG TBEF tablet Take 325 mg by mouth every 6 (six) hours as needed (cold sx).    [provider]  cyclobenzaprine (FLEXERIL) 10 MG tablet Take 1 tablet (10 mg total) by mouth 2 (two) times daily as needed (muscle  pain). 09/14/16   Janne Napoleon, NP  diclofenac (VOLTAREN) 50 MG EC tablet Take 1 tablet (50 mg total) by mouth 2 (two) times daily. 09/14/16   Janne Napoleon, NP  HYDROcodone-acetaminophen (NORCO) 5-325 MG tablet Take 1 tablet by mouth every 6 (six) hours as needed. 09/14/16   Janne Napoleon, NP  Multiple Vitamin (MULTIVITAMIN) tablet Take 1 tablet by mouth daily.    [provider]  ondansetron (ZOFRAN) 4 MG tablet  Take 1 tablet (4 mg total) by mouth every 6 (six) hours. 10/16/15   Cheri Fowler, PA-C  metoCLOPramide (REGLAN) 10 MG tablet Take 1 tablet (10 mg total) by mouth every 6 (six) hours as needed (nausea/headache). Patient not taking: Reported on 10/10/2014 12/11/12 10/10/14  Doug Sou, MD    Family History No family history on file.  Social History Social History   Tobacco Use  . Smoking status: Never Smoker  Substance Use Topics  . Alcohol use: No  . Drug use: No     Allergies   Patient has no known allergies.   Review of Systems Review of Systems  Constitutional: Positive for diaphoresis. Negative for chills and fever.  HENT: Negative for rhinorrhea and sore throat.   Eyes: Positive for visual disturbance.  Respiratory: Negative for cough and shortness of breath.   Cardiovascular: Positive for chest pain. Negative for leg swelling.  Gastrointestinal: Positive for abdominal pain, nausea and vomiting. Negative for blood in stool, constipation and diarrhea.  Genitourinary: Positive for vaginal bleeding (on menses). Negative for dysuria.  Musculoskeletal: Negative for arthralgias and myalgias.  Skin: Negative for color change.  Allergic/Immunologic: Negative for immunocompromised state.  Neurological: Positive for light-headedness and headaches. Negative for weakness and numbness.  Psychiatric/Behavioral: Negative for confusion.   All other systems reviewed and are negative for acute change except as noted in the HPI.    Physical Exam Updated Vital Signs BP (!) 164/69 (BP Location: Right Arm)   Pulse 71   Temp 98.2 F (36.8 C) (Oral)   Resp (!) 22   SpO2 95%   Physical Exam Vitals signs and nursing note reviewed.  Constitutional:      General: She is not in acute distress.    Appearance: Normal appearance. She is well-developed. She is not toxic-appearing.     Comments: Afebrile, nontoxic, NAD  HENT:     Head: Normocephalic and atraumatic.  Eyes:     General: Vision  grossly intact.        Right eye: No discharge.        Left eye: No discharge.     Extraocular Movements: Extraocular movements intact.     Conjunctiva/sclera: Conjunctivae normal.     Pupils: Pupils are equal, round, and reactive to light.     Comments: PERRL, EOMI, no nystagmus  Neck:     Musculoskeletal: Normal range of motion and neck supple. Normal range of motion. No neck rigidity, spinous process tenderness or muscular tenderness.     Comments: FROM intact without spinous process TTP, no bony stepoffs or deformities, no paraspinous muscle TTP or muscle spasms. No rigidity or meningeal signs. No bruising or swelling.  Cardiovascular:     Rate and Rhythm: Normal rate and regular rhythm.     Pulses: Normal pulses.     Heart sounds: Normal heart sounds, S1 normal and S2 normal. No murmur. No friction rub. No gallop.      Comments: RRR, nl s1/s2, no m/r/g, distal pulses intact, no pedal edema  Pulmonary:     Effort: Pulmonary effort is normal. No respiratory distress.     Breath sounds: Normal breath sounds. No decreased breath sounds, wheezing, rhonchi or rales.     Comments: CTAB in all lung fields, no w/r/r, no hypoxia or increased WOB, speaking in full sentences, SpO2 97% on RA during exam Chest:     Chest wall: Tenderness present. No deformity or crepitus.       Comments: Chest wall with mild TTP centrally and extending over the epigastric abdominal region, without crepitus, deformities, or retractions  Abdominal:     General: Bowel sounds are normal. There is no distension.     Palpations: Abdomen is soft. Abdomen is not rigid.     Tenderness: There is abdominal tenderness in the right upper quadrant and epigastric area. There is no right CVA tenderness, left CVA tenderness, guarding or rebound. Positive signs include Murphy's sign. Negative signs include McBurney's sign.     Comments: Soft, obese but nondistended, +BS throughout, with mild RUQ and epigastric TTP, no r/g/r,  +murphy's, neg mcburney's, no CVA TTP   Musculoskeletal: Normal range of motion.     Comments: MAE x4 Strength and sensation grossly intact in all extremities Distal pulses intact Gait steady  Skin:    General: Skin is warm and dry.     Findings: No rash.  Neurological:     General: No focal deficit present.     Mental Status: She is alert and oriented to person, place, and time.     GCS: GCS eye subscore is 4. GCS verbal subscore is 5. GCS motor subscore is 6.     Cranial Nerves: Cranial nerves are intact. No cranial nerve deficit.     Sensory: Sensation is intact. No sensory deficit.     Motor: Motor function is intact.     Coordination: Coordination is intact. Coordination normal.     Gait: Gait normal.     Comments: CN 2-12 grossly intact A&O x4 GCS 15 Sensation and strength intact Gait nonataxic Coordination with finger-to-nose WNL  Psychiatric:        Mood and Affect: Mood and affect normal.        Behavior: Behavior normal.      ED Treatments / Results  Labs (all labs ordered are listed, but only abnormal results are displayed) Labs Reviewed  CBC WITH DIFFERENTIAL/PLATELET - Abnormal; Notable for the following components:      Result Value   Hemoglobin 11.3 (*)    HCT 35.1 (*)    All other components within normal limits  COMPREHENSIVE METABOLIC PANEL - Abnormal; Notable for the following components:   Glucose, Bld 101 (*)    Calcium 7.9 (*)    Total Protein 6.4 (*)    Albumin 3.2 (*)    Total Bilirubin 1.3 (*)    All other components within normal limits  URINALYSIS, ROUTINE W REFLEX MICROSCOPIC - Abnormal; Notable for the following components:   Color, Urine RED (*)    APPearance HAZY (*)    Hgb urine dipstick LARGE (*)    Protein, ur 30 (*)    Leukocytes,Ua MODERATE (*)    RBC / HPF >50 (*)    Bacteria, UA FEW (*)    All other components within normal limits  LIPASE, BLOOD  MAGNESIUM  I-STAT BETA HCG BLOOD, ED ([facility], [facility], [facility] ONLY)  I-STAT TROPONIN, ED   I-STAT TROPONIN, ED    EKG EKG Interpretation  Date/Time:  Saturday August 04 2018  13:58:20 EST Ventricular Rate:  59 PR Interval:    QRS Duration: 89 QT Interval:  397 QTC Calculation: 394 R Axis:   8 Text Interpretation:  Sinus rhythm Borderline T wave abnormalities Confirmed by Virgina Norfolk [phone]) on 08/04/2018 2:02:07 PM   Radiology Dg Chest 2 View  Result Date: 08/04/2018 CLINICAL DATA:  Central chest pain for 2 days EXAM: CHEST - 2 VIEW COMPARISON:  10-16-15 FINDINGS: The heart size and mediastinal contours are within normal limits. Both lungs are clear. The visualized skeletal structures are unremarkable. IMPRESSION: No active cardiopulmonary disease. Electronically Signed   By: Alcide Clever M.D.   On: 08/04/2018 15:58   Ct Head Wo Contrast  Result Date: 08/04/2018 CLINICAL DATA:  Vomiting and headaches EXAM: CT HEAD WITHOUT CONTRAST TECHNIQUE: Contiguous axial images were obtained from the base of the skull through the vertex without intravenous contrast. COMPARISON:  10/16/2015 FINDINGS: Brain: No evidence of acute infarction, hemorrhage, hydrocephalus, extra-axial collection or mass lesion/mass effect. Vascular: No hyperdense vessel or unexpected calcification. Skull: Normal. Negative for fracture or focal lesion. Sinuses/Orbits: No acute finding. Other: None. IMPRESSION: Normal head CT Electronically Signed   By: Alcide Clever M.D.   On: 08/04/2018 16:03   US Abdomen Complete  Result Date: 08/04/2018 CLINICAL DATA:  Upper abdominal pain for 1 day EXAM: ABDOMEN ULTRASOUND COMPLETE COMPARISON:  None. FINDINGS: Gallbladder: No gallstones or wall thickening visualized. No sonographic Murphy sign noted by sonographer. Common bile duct: Diameter: 2 mm Liver: Mild increased echogenicity is noted consistent with fatty infiltration. No focal mass lesion is noted. Portal vein is patent on color Doppler imaging with normal direction of blood flow towards the liver. IVC: No abnormality  visualized. Pancreas: Visualized portion unremarkable. Spleen: Size and appearance within normal limits. Accessory spleen is noted adjacent to the splenic hilum. Right Kidney: Length: 11.7 cm. Echogenicity within normal limits. No mass or hydronephrosis visualized. Left Kidney: Length: 12.5 cm. Echogenicity within normal limits. No mass or hydronephrosis visualized. Abdominal aorta: No aneurysm visualized. Other findings: None. IMPRESSION: No acute abnormality noted. Electronically Signed   By: Alcide Clever M.D.   On: 08/04/2018 16:01    Procedures Procedures (including critical care time)  Medications Ordered in ED Medications  sodium chloride 0.9 % bolus 1,000 mL (0 mLs Intravenous Stopped 08/04/18 1727)  ondansetron (ZOFRAN) injection 4 mg (4 mg Intravenous Given 08/04/18 1429)  aspirin chewable tablet 324 mg (324 mg Oral Given 08/04/18 1429)  alum & mag hydroxide-simeth (MAALOX/MYLANTA) 200-200-20 MG/5ML suspension 30 mL (30 mLs Oral Given 08/04/18 1429)    And  lidocaine (XYLOCAINE) 2 % viscous mouth solution 15 mL (15 mLs Oral Given 08/04/18 1429)  morphine 4 MG/ML injection 4 mg (4 mg Intravenous Given 08/04/18 1429)  promethazine (PHENERGAN) injection 25 mg (25 mg Intravenous Given 08/04/18 1603)     Initial Impression / Assessment and Plan / ED Course  I have reviewed the triage vital signs and the nursing notes.  Pertinent labs & imaging results that were available during my care of the patient were reviewed by me and considered in my medical decision making (see chart for details).        47 y.o. female here with n/v x1 day, then today developed CP, lightheadedness, diaphoresis, R headache with blurred vision, and upper abd pain. On exam, lips dry, mild central chest wall tenderness extending to epigastric area, +RUQ TTP with +murphy's, nonperitoneal, clear lung exam, no tachycardia or hypoxia, no pedal edema, no focal neuro deficits. Suspect  GI issue as primary source, likely causing  chest pain due to indigestion vs from vomiting, and possibly contributing to headache from the vomiting. Will get labs, CXR, abd U/S, and CT head to further evaluate her condition. Doubt dissection or PE. Will give ASA, morphine, GI cocktail, zofran, and fluids, and reassess shortly. Discussed case with my attending Dr. Lockie Mola who agrees with plan.   6:34 PM CBC w/diff with mild stable anemia. CMP with mildly elevated Tbili 1.3 and low Ca 7.9, checked Mg level which is WNL, will send home with Tums and advised further outpatient f/up for this finding. Lipase WNL. BetaHCG neg. Trop neg x2. U/A contaminated but without evidence of UTI. CXR negative. Abd U/S negative. CT head negative. EKG with some nonspecific borderline T wave changes but otherwise nonischemic. Pt feeling better and tolerating PO well, BP improving. Suspect some component of gastritis/GI illness leading to vomiting which lead to chest wall pain and headache. Doubt other emergent pathology, doubt need for further emergent work up. Will send home with phenergan and tums, advised other diet/lifestyle modifications for symptom remedy, OTC remedies for symptomatic relief, and f/up with PCP in 5-7 days for recheck. I explained the diagnosis and have given explicit precautions to return to the ER including for any other new or worsening symptoms. The patient understands and accepts the medical plan as it's been dictated and I have answered their questions. Discharge instructions concerning home care and prescriptions have been given. The patient is STABLE and is discharged to home in good condition.    Final Clinical Impressions(s) / ED Diagnoses   Final diagnoses:  Nausea and vomiting in adult patient  Lightheadedness  Atypical chest pain  Upper abdominal pain  Nonintractable episodic headache, unspecified headache type  Hypocalcemia    ED Discharge Orders         Ordered    calcium carbonate (TUMS) 500 MG chewable tablet  3 times  daily with meals     08/04/18 1834    promethazine (PHENERGAN) 25 MG tablet  Every 6 hours PRN     08/04/18 [address] [place], PA-C 08/04/18 1835    Virgina Norfolk, DO 08/05/18 [phone]

## 2018-08-04 NOTE — ED Notes (Signed)
Patient verbalizes understanding of discharge instructions. Opportunity for questioning and answers were provided. Armband removed by staff, pt discharged from ED home via POV with family. 

## 2018-08-04 NOTE — Discharge Instructions (Signed)
Your work-up today was reassuring, but did show that you have a low calcium level.  Take Tums as directed to help with this, see list of foods below to help supplement calcium in your diet, and follow-up with your regular doctor for recheck of your calcium level as well as possible other blood tests.  Use Phenergan as directed, as needed for nausea.  Stay well-hydrated.  Alternate between Tylenol and ibuprofen as needed for pain, but never take either on an empty stomach. Use over the counter maalox, pepto bismol, etc to help with indigestion. Avoid spicy/fatty/fried/acidic foods.  Follow-up with your regular doctor in the next 5-7 days for reassessment.  Return to the ER for emergent changes or worsening in symptoms.

## 2019-05-16 ENCOUNTER — Ambulatory Visit (HOSPITAL_COMMUNITY)
Admission: EM | Admit: 2019-05-16 | Discharge: 2019-05-16 | Discharge status: Against Medical Advice | Payer: BC Managed Care – PPO

## 2019-05-16 ENCOUNTER — Other Ambulatory Visit: Payer: Self-pay

## 2019-05-16 NOTE — ED Notes (Signed)
Pt sat outside to wait for triage.  Nosebleed stopped so they decided to go home and rest instead of staying, per front desk Leigh.

## 2020-08-30 ENCOUNTER — Other Ambulatory Visit: Payer: Self-pay

## 2020-08-30 ENCOUNTER — Emergency Department (HOSPITAL_COMMUNITY)
Admission: EM | Admit: 2020-08-30 | Discharge: 2020-08-30 | Disposition: A | Discharge status: Home / Self Care | Payer: BC Managed Care – PPO | Source: Home / Self Care | Attending: Emergency Medicine | Admitting: Emergency Medicine

## 2020-08-30 ENCOUNTER — Emergency Department (HOSPITAL_COMMUNITY): Payer: BC Managed Care – PPO

## 2020-08-30 DIAGNOSIS — I160 Hypertensive urgency: Secondary | ICD-10-CM | POA: Diagnosis not present

## 2020-08-30 DIAGNOSIS — R001 Bradycardia, unspecified: Secondary | ICD-10-CM | POA: Insufficient documentation

## 2020-08-30 DIAGNOSIS — I10 Essential (primary) hypertension: Secondary | ICD-10-CM | POA: Insufficient documentation

## 2020-08-30 DIAGNOSIS — R112 Nausea with vomiting, unspecified: Secondary | ICD-10-CM | POA: Insufficient documentation

## 2020-08-30 DIAGNOSIS — R030 Elevated blood-pressure reading, without diagnosis of hypertension: Secondary | ICD-10-CM

## 2020-08-30 DIAGNOSIS — R0682 Tachypnea, not elsewhere classified: Secondary | ICD-10-CM | POA: Insufficient documentation

## 2020-08-30 DIAGNOSIS — R1084 Generalized abdominal pain: Secondary | ICD-10-CM | POA: Insufficient documentation

## 2020-08-30 DIAGNOSIS — R1013 Epigastric pain: Secondary | ICD-10-CM

## 2020-08-30 DIAGNOSIS — R079 Chest pain, unspecified: Secondary | ICD-10-CM | POA: Diagnosis not present

## 2020-08-30 DIAGNOSIS — R0789 Other chest pain: Secondary | ICD-10-CM | POA: Insufficient documentation

## 2020-08-30 DIAGNOSIS — Z79899 Other long term (current) drug therapy: Secondary | ICD-10-CM | POA: Insufficient documentation

## 2020-08-30 LAB — COMPREHENSIVE METABOLIC PANEL
ALT: 23 U/L (ref 0–44)
AST: 21 U/L (ref 15–41)
Albumin: 3.9 g/dL (ref 3.5–5.0)
Alkaline Phosphatase: 59 U/L (ref 38–126)
Anion gap: 7 (ref 5–15)
BUN: 11 mg/dL (ref 6–20)
CO2: 27 mmol/L (ref 22–32)
Calcium: 9.4 mg/dL (ref 8.9–10.3)
Chloride: 105 mmol/L (ref 98–111)
Creatinine, Ser: 1.04 mg/dL — ABNORMAL HIGH (ref 0.44–1.00)
GFR, Estimated: >60 mL/min (ref >60)
Glucose, Bld: 111 mg/dL — ABNORMAL HIGH (ref 70–99)
Potassium: 3.6 mmol/L (ref 3.5–5.1)
Sodium: 139 mmol/L (ref 135–145)
Total Bilirubin: 1.2 mg/dL (ref 0.3–1.2)
Total Protein: 8.1 g/dL (ref 6.5–8.1)

## 2020-08-30 LAB — TROPONIN I (HIGH SENSITIVITY)
Troponin I (High Sensitivity): 14 ng/L (ref <18)
Troponin I (High Sensitivity): 17 ng/L (ref <18)

## 2020-08-30 LAB — CBC
HCT: 40 % (ref 36.0–46.0)
Hemoglobin: 13.5 g/dL (ref 12.0–15.0)
MCH: 30 pg (ref 26.0–34.0)
MCHC: 33.8 g/dL (ref 30.0–36.0)
MCV: 88.9 fL (ref 80.0–100.0)
Platelets: 294 10*3/uL (ref 150–400)
RBC: 4.5 MIL/uL (ref 3.87–5.11)
RDW: 13.8 % (ref 11.5–15.5)
WBC: 5.9 10*3/uL (ref 4.0–10.5)
nRBC: 0 % (ref 0.0–0.2)

## 2020-08-30 LAB — LIPASE, BLOOD: Lipase: 49 U/L (ref 11–51)

## 2020-08-30 MED ORDER — OMEPRAZOLE 40 MG PO CPDR: 40.0000 mg | DELAYED_RELEASE_CAPSULE | Freq: Every day | ORAL | 0 refills | Status: AC

## 2020-08-30 MED ORDER — METOCLOPRAMIDE HCL 5 MG/ML IJ SOLN
10.0000 mg | Freq: Once | INTRAMUSCULAR | Status: AC
  Administered 2020-08-30: 10 mg via INTRAVENOUS
  Filled 2020-08-30: qty 2

## 2020-08-30 MED ORDER — HYDROCHLOROTHIAZIDE 25 MG PO TABS: 25.0000 mg | ORAL_TABLET | Freq: Every day | ORAL | 0 refills | Status: DC

## 2020-08-30 MED ORDER — ONDANSETRON HCL 4 MG/2ML IJ SOLN: 4.0000 mg | Freq: Once | INTRAMUSCULAR | Status: DC

## 2020-08-30 MED ORDER — LIDOCAINE VISCOUS HCL 2 % MT SOLN
15.0000 mL | Freq: Once | OROMUCOSAL | Status: AC
  Administered 2020-08-30: 15 mL via ORAL
  Filled 2020-08-30: qty 15

## 2020-08-30 MED ORDER — SUCRALFATE 1 GM/10ML PO SUSP: 1.0000 g | Freq: Three times a day (TID) | ORAL | 0 refills | Status: AC

## 2020-08-30 MED ORDER — DIPHENHYDRAMINE HCL 50 MG/ML IJ SOLN
12.5000 mg | Freq: Once | INTRAMUSCULAR | Status: AC
  Administered 2020-08-30: 12.5 mg via INTRAVENOUS
  Filled 2020-08-30: qty 1

## 2020-08-30 MED ORDER — HYDRALAZINE HCL 20 MG/ML IJ SOLN
5.0000 mg | Freq: Once | INTRAMUSCULAR | Status: AC
  Administered 2020-08-30: 5 mg via INTRAVENOUS
  Filled 2020-08-30: qty 1

## 2020-08-30 MED ORDER — HYDROMORPHONE HCL 1 MG/ML IJ SOLN
1.0000 mg | Freq: Once | INTRAMUSCULAR | Status: AC
  Administered 2020-08-30: 1 mg via INTRAVENOUS
  Filled 2020-08-30: qty 1

## 2020-08-30 MED ORDER — OXYCODONE-ACETAMINOPHEN 5-325 MG PO TABS
1.0000 | ORAL_TABLET | Freq: Once | ORAL | Status: AC
  Administered 2020-08-30: 1 via ORAL
  Filled 2020-08-30: qty 1

## 2020-08-30 MED ORDER — SODIUM CHLORIDE 0.9 % IV BOLUS
1000.0000 mL | Freq: Once | INTRAVENOUS | Status: AC
  Administered 2020-08-30: 1000 mL via INTRAVENOUS

## 2020-08-30 MED ORDER — SODIUM CHLORIDE 0.9 % IV SOLN
12.5000 mg | Freq: Once | INTRAVENOUS | Status: AC
  Administered 2020-08-30: 12.5 mg via INTRAVENOUS
  Filled 2020-08-30: qty 0.5

## 2020-08-30 MED ORDER — ALUM & MAG HYDROXIDE-SIMETH 200-200-20 MG/5ML PO SUSP
30.0000 mL | Freq: Once | ORAL | Status: AC
  Administered 2020-08-30: 30 mL via ORAL
  Filled 2020-08-30: qty 30

## 2020-08-30 MED ORDER — ONDANSETRON 4 MG PO TBDP: 4.0000 mg | ORAL_TABLET | Freq: Three times a day (TID) | ORAL | 0 refills | Status: AC | PRN

## 2020-08-30 MED ORDER — IOHEXOL 350 MG/ML SOLN
100.0000 mL | Freq: Once | INTRAVENOUS | Status: AC | PRN
  Administered 2020-08-30: 100 mL via INTRAVENOUS

## 2020-08-30 NOTE — ED Triage Notes (Signed)
Pt BIBA from home for a HTN crisis. Pt has been nauseous and vomiting for a day and started to have chest pain with SOB about 3-4 hours ago with head pain. Per EMS, BP 264/144. Pt is moaning in pain in triage. Rec'd 4mg  Zofran en route.

## 2020-08-30 NOTE — ED Provider Notes (Signed)
Care transfer from previous EDPA at shift change. See previous note for full H&P.   Physical Exam  BP (!) 163/89   Pulse 88   Temp 98.8 F (37.1 C) (Oral)   Resp 14   SpO2 95%   Physical Exam Vitals and nursing note reviewed.  Constitutional:      General: She is not in acute distress.    Appearance: She is well-developed.     Comments: Asleep when I enter the room. Son at bedside. No distress   HENT:     Head: Normocephalic and atraumatic.     Right Ear: External ear normal.     Left Ear: External ear normal.     Nose: Nose normal.  Eyes:     General: No scleral icterus.    Conjunctiva/sclera: Conjunctivae normal.  Cardiovascular:     Rate and Rhythm: Normal rate and regular rhythm.     Heart sounds: Normal heart sounds. No murmur heard.   Pulmonary:     Effort: Pulmonary effort is normal.     Breath sounds: Normal breath sounds. No wheezing.  Chest:     Chest wall: Tenderness present.     Comments: Subxiphoid and bilateral parasternal borders left worse than right  Abdominal:     Palpations: Abdomen is soft.     Tenderness: There is abdominal tenderness (epigastrium).  Musculoskeletal:        General: No deformity. Normal range of motion.     Cervical back: Normal range of motion and neck supple.  Skin:    General: Skin is warm and dry.     Capillary Refill: Capillary refill takes less than 2 seconds.  Neurological:     Mental Status: She is alert and oriented to person, place, and time.  Psychiatric:        Behavior: Behavior normal.        Thought Content: Thought content normal.        Judgment: Judgment normal.     ED Course/Procedures   Clinical Course as of 08/30/20 2024  Sun Aug 30, 2020  1537 Shift change  Severe hypertension, off medicines for years Last night had chinese food, had intractable vomiting, headaches, dehydrated through the night This AM developed ripping severe chest pain, SOB, clutched her chest. Her mother died of AAA or  dissection? Looked bad on arrival.  Pain medicine, nausea medicine, IVF given.  Pending CT dissection study  If negative scan and delta trop suspect GI process.  Recommend discharge with GI follow up, carafate, PPI, HCTZ, BP control  [CG]  1733 Re-evaluated patient and updated on CT scan results and pending troponin. Anticipate discharge. Requests more pain and nuasea medicine. Has headache still and continued substernal chest and epigastric pain,  both reproducible. Will give migraine cocktail reglan/benadryl, percocet and GI cocktail. Pending trop.  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  This was discussed with patient and son, she is aware she needs serial CT scan screening. Trop x 2 negative. BP improved. Suspect GI etiology of n/v/abdominal and epigastric pain.  Discussed diet modification, ibuprofen/aspirin avoidance, alcohol and marijuana avoidance. Will dc with PPI, carafate, zofran, GI follow up.  Will put on HCTZ for BP. Recommended PCP appointment for ER follow up in 1 week. Return precautions given. Patient and son at bedside in agreement.       Sharen Heck  J, PA-C 08/30/20 2024    Terrilee Files, MD 08/31/20 1028

## 2020-08-30 NOTE — ED Notes (Signed)
Pt back from CT

## 2020-08-30 NOTE — Discharge Instructions (Addendum)
You were seen in the ER for several symptoms including headache, chest pain, shortness of breath, nausea, vomiting, abdominal pain  CT showed a 4.4 cm ascending thoracic aorta aneurysm.  This needs to be monitored every year with CT scans. Please talk to your primary care doctor for yearly scans.  Lab work and Cts were all ok  The cause of your nausea, vomiting, chest and abdominal pain may be related to acid reflux stomach irritation   We will treat this with anti-acid medicines.  Start taking omeprazole to 40 mg daily, take on an empty stomach first thing in the morning and wait 20-30 min before eating.  Use sucralfate solution 20 min before meals and at bed time. Use zofran for nausea.  For pain take [phone] mg acetaminophen every 6 hours.   Avoid irritating foods and liquids such as alcohol, greasy/fatty or acidic foods. Avoid ibuprofen or aspirin containing products. Avoid marijuana  Return to the ER for fever, chills, blood in vomit or stool, worsening localized abdominal pain to right upper or right lower abdomen, inability to tolerate fluids.  IMPRESSION:  1. Ascending thoracic aortic aneurysm measuring up to 4.4 cm.  Recommend annual imaging followup by CTA or MRA. This recommendation  follows 2010 ACCF/AHA/AATS/ACR/ASA/SCA/SCAI/SIR/STS/SVM Guidelines  for the Diagnosis and Management of Patients with Thoracic Aortic  Disease. Circulation. 2010; 121: V668-D594. Aortic aneurysm NOS  (ICD10-I71.9)  2. Patchy dependent bibasilar opacities, favor atelectasis.  Superimposed pneumonia would be difficult to exclude.  3. Mild cardiomegaly.  4. No acute findings abdomen or pelvis.

## 2020-08-31 ENCOUNTER — Encounter (HOSPITAL_COMMUNITY): Payer: Self-pay | Admitting: Internal Medicine

## 2020-08-31 ENCOUNTER — Observation Stay (HOSPITAL_COMMUNITY): Payer: BC Managed Care – PPO

## 2020-08-31 ENCOUNTER — Inpatient Hospital Stay (HOSPITAL_COMMUNITY)
Admission: EM | Admit: 2020-08-31 | Discharge: 2020-09-03 | DRG: 305 | Disposition: A | Discharge status: Home / Self Care | Payer: BC Managed Care – PPO | Attending: Family Medicine | Admitting: Family Medicine

## 2020-08-31 ENCOUNTER — Other Ambulatory Visit: Payer: Self-pay

## 2020-08-31 DIAGNOSIS — Z79899 Other long term (current) drug therapy: Secondary | ICD-10-CM

## 2020-08-31 DIAGNOSIS — I160 Hypertensive urgency: Principal | ICD-10-CM | POA: Diagnosis present

## 2020-08-31 DIAGNOSIS — R778 Other specified abnormalities of plasma proteins: Secondary | ICD-10-CM

## 2020-08-31 DIAGNOSIS — Z8249 Family history of ischemic heart disease and other diseases of the circulatory system: Secondary | ICD-10-CM

## 2020-08-31 DIAGNOSIS — Z20822 Contact with and (suspected) exposure to covid-19: Secondary | ICD-10-CM | POA: Diagnosis present

## 2020-08-31 DIAGNOSIS — R1010 Upper abdominal pain, unspecified: Secondary | ICD-10-CM

## 2020-08-31 DIAGNOSIS — E876 Hypokalemia: Secondary | ICD-10-CM | POA: Diagnosis present

## 2020-08-31 DIAGNOSIS — R079 Chest pain, unspecified: Secondary | ICD-10-CM | POA: Diagnosis present

## 2020-08-31 DIAGNOSIS — Z880 Allergy status to penicillin: Secondary | ICD-10-CM

## 2020-08-31 DIAGNOSIS — I10 Essential (primary) hypertension: Secondary | ICD-10-CM

## 2020-08-31 DIAGNOSIS — R109 Unspecified abdominal pain: Secondary | ICD-10-CM

## 2020-08-31 DIAGNOSIS — R0782 Intercostal pain: Secondary | ICD-10-CM

## 2020-08-31 DIAGNOSIS — I712 Thoracic aortic aneurysm, without rupture: Secondary | ICD-10-CM | POA: Diagnosis present

## 2020-08-31 DIAGNOSIS — Z8241 Family history of sudden cardiac death: Secondary | ICD-10-CM

## 2020-08-31 DIAGNOSIS — E785 Hyperlipidemia, unspecified: Secondary | ICD-10-CM | POA: Diagnosis present

## 2020-08-31 DIAGNOSIS — Z885 Allergy status to narcotic agent status: Secondary | ICD-10-CM

## 2020-08-31 DIAGNOSIS — K529 Noninfective gastroenteritis and colitis, unspecified: Secondary | ICD-10-CM | POA: Diagnosis present

## 2020-08-31 DIAGNOSIS — Z6832 Body mass index (BMI) 32.0-32.9, adult: Secondary | ICD-10-CM

## 2020-08-31 DIAGNOSIS — I248 Other forms of acute ischemic heart disease: Secondary | ICD-10-CM | POA: Diagnosis present

## 2020-08-31 DIAGNOSIS — R7989 Other specified abnormal findings of blood chemistry: Secondary | ICD-10-CM

## 2020-08-31 DIAGNOSIS — Z9851 Tubal ligation status: Secondary | ICD-10-CM

## 2020-08-31 DIAGNOSIS — Z803 Family history of malignant neoplasm of breast: Secondary | ICD-10-CM

## 2020-08-31 DIAGNOSIS — E669 Obesity, unspecified: Secondary | ICD-10-CM | POA: Diagnosis present

## 2020-08-31 HISTORY — DX: Unspecified abdominal pain: R10.9

## 2020-08-31 LAB — BASIC METABOLIC PANEL
Anion gap: 8 (ref 5–15)
BUN: 14 mg/dL (ref 6–20)
CO2: 27 mmol/L (ref 22–32)
Calcium: 9.3 mg/dL (ref 8.9–10.3)
Chloride: 101 mmol/L (ref 98–111)
Creatinine, Ser: 1.08 mg/dL — ABNORMAL HIGH (ref 0.44–1.00)
GFR, Estimated: >60 mL/min (ref >60)
Glucose, Bld: 109 mg/dL — ABNORMAL HIGH (ref 70–99)
Potassium: 3.3 mmol/L — ABNORMAL LOW (ref 3.5–5.1)
Sodium: 136 mmol/L (ref 135–145)

## 2020-08-31 LAB — HEPATIC FUNCTION PANEL
ALT: 24 U/L (ref 0–44)
AST: 20 U/L (ref 15–41)
Albumin: 4.1 g/dL (ref 3.5–5.0)
Alkaline Phosphatase: 62 U/L (ref 38–126)
Bilirubin, Direct: 0.2 mg/dL (ref 0.0–0.2)
Indirect Bilirubin: 1.9 mg/dL — ABNORMAL HIGH (ref 0.3–0.9)
Total Bilirubin: 2.1 mg/dL — ABNORMAL HIGH (ref 0.3–1.2)
Total Protein: 8.7 g/dL — ABNORMAL HIGH (ref 6.5–8.1)

## 2020-08-31 LAB — CBC
HCT: 40.2 % (ref 36.0–46.0)
Hemoglobin: 13.3 g/dL (ref 12.0–15.0)
MCH: 29.3 pg (ref 26.0–34.0)
MCHC: 33.1 g/dL (ref 30.0–36.0)
MCV: 88.5 fL (ref 80.0–100.0)
Platelets: 295 10*3/uL (ref 150–400)
RBC: 4.54 MIL/uL (ref 3.87–5.11)
RDW: 13.8 % (ref 11.5–15.5)
WBC: 7.9 10*3/uL (ref 4.0–10.5)
nRBC: 0 % (ref 0.0–0.2)

## 2020-08-31 LAB — I-STAT BETA HCG BLOOD, ED (MC, WL, AP ONLY): I-stat hCG, quantitative: <5 m[IU]/mL (ref <5)

## 2020-08-31 LAB — RESP PANEL BY RT-PCR (FLU A&B, COVID) ARPGX2
Influenza A by PCR: NEGATIVE
Influenza B by PCR: NEGATIVE
SARS Coronavirus 2 by RT PCR: NEGATIVE

## 2020-08-31 LAB — HIV ANTIBODY (ROUTINE TESTING W REFLEX): HIV Screen 4th Generation wRfx: NONREACTIVE

## 2020-08-31 LAB — LIPASE, BLOOD: Lipase: 42 U/L (ref 11–51)

## 2020-08-31 LAB — TROPONIN I (HIGH SENSITIVITY)
Troponin I (High Sensitivity): 22 ng/L — ABNORMAL HIGH (ref <18)
Troponin I (High Sensitivity): 24 ng/L — ABNORMAL HIGH (ref <18)
Troponin I (High Sensitivity): 22 ng/L — ABNORMAL HIGH (ref <18)

## 2020-08-31 MED ORDER — LABETALOL HCL 5 MG/ML IV SOLN
20.0000 mg | Freq: Once | INTRAVENOUS | Status: AC
  Administered 2020-08-31: 20 mg via INTRAVENOUS
  Filled 2020-08-31: qty 4

## 2020-08-31 MED ORDER — CYCLOBENZAPRINE HCL 5 MG PO TABS
5.0000 mg | ORAL_TABLET | Freq: Three times a day (TID) | ORAL | Status: DC | PRN
  Administered 2020-09-01: 5 mg via ORAL
  Filled 2020-08-31: qty 1

## 2020-08-31 MED ORDER — HYDROCODONE-ACETAMINOPHEN 5-325 MG PO TABS
1.0000 | ORAL_TABLET | Freq: Four times a day (QID) | ORAL | Status: DC | PRN
Start: 2020-08-31 — End: 2020-09-03
  Administered 2020-09-02 – 2020-09-03 (×2): 1 via ORAL
  Filled 2020-08-31 (×3): qty 1

## 2020-08-31 MED ORDER — ENOXAPARIN SODIUM 40 MG/0.4ML ~~LOC~~ SOLN
40.0000 mg | SUBCUTANEOUS | Status: DC
  Administered 2020-08-31 – 2020-09-02 (×3): 40 mg via SUBCUTANEOUS
  Filled 2020-08-31 (×3): qty 0.4

## 2020-08-31 MED ORDER — PANTOPRAZOLE SODIUM 40 MG PO TBEC
40.0000 mg | DELAYED_RELEASE_TABLET | Freq: Two times a day (BID) | ORAL | Status: DC
  Administered 2020-09-01 – 2020-09-03 (×5): 40 mg via ORAL
  Filled 2020-08-31 (×5): qty 1

## 2020-08-31 MED ORDER — ALUM & MAG HYDROXIDE-SIMETH 200-200-20 MG/5ML PO SUSP
30.0000 mL | Freq: Once | ORAL | Status: AC
  Administered 2020-08-31: 30 mL via ORAL
  Filled 2020-08-31: qty 30

## 2020-08-31 MED ORDER — ONDANSETRON HCL 4 MG/2ML IJ SOLN
4.0000 mg | Freq: Four times a day (QID) | INTRAMUSCULAR | Status: DC | PRN
  Administered 2020-09-01 – 2020-09-02 (×3): 4 mg via INTRAVENOUS
  Filled 2020-08-31 (×3): qty 2

## 2020-08-31 MED ORDER — SUCRALFATE 1 GM/10ML PO SUSP
1.0000 g | Freq: Three times a day (TID) | ORAL | Status: DC
  Administered 2020-08-31 – 2020-09-03 (×8): 1 g via ORAL
  Filled 2020-08-31 (×12): qty 10

## 2020-08-31 MED ORDER — ALPRAZOLAM 0.25 MG PO TABS
0.2500 mg | ORAL_TABLET | Freq: Two times a day (BID) | ORAL | Status: DC | PRN
  Administered 2020-09-01: 0.25 mg via ORAL
  Filled 2020-08-31: qty 1

## 2020-08-31 MED ORDER — FAMOTIDINE IN NACL 20-0.9 MG/50ML-% IV SOLN
20.0000 mg | Freq: Once | INTRAVENOUS | Status: AC
  Administered 2020-08-31: 20 mg via INTRAVENOUS
  Filled 2020-08-31: qty 50

## 2020-08-31 MED ORDER — DIPHENHYDRAMINE HCL 50 MG/ML IJ SOLN
25.0000 mg | Freq: Once | INTRAMUSCULAR | Status: AC
  Administered 2020-08-31: 25 mg via INTRAVENOUS
  Filled 2020-08-31: qty 1

## 2020-08-31 MED ORDER — METOPROLOL TARTRATE 25 MG PO TABS
25.0000 mg | ORAL_TABLET | Freq: Two times a day (BID) | ORAL | Status: DC
  Filled 2020-08-31: qty 1

## 2020-08-31 MED ORDER — ACETAMINOPHEN 325 MG PO TABS
650.0000 mg | ORAL_TABLET | ORAL | Status: DC | PRN
  Administered 2020-09-02: 650 mg via ORAL
  Filled 2020-08-31: qty 2

## 2020-08-31 MED ORDER — DROPERIDOL 2.5 MG/ML IJ SOLN
2.5000 mg | Freq: Once | INTRAMUSCULAR | Status: AC
  Administered 2020-08-31: 2.5 mg via INTRAVENOUS
  Filled 2020-08-31: qty 2

## 2020-08-31 MED ORDER — MORPHINE SULFATE (PF) 2 MG/ML IV SOLN
2.0000 mg | INTRAVENOUS | Status: DC | PRN
  Administered 2020-08-31 – 2020-09-01 (×5): 2 mg via INTRAVENOUS
  Filled 2020-08-31 (×5): qty 1

## 2020-08-31 MED ORDER — ASPIRIN EC 81 MG PO TBEC
81.0000 mg | DELAYED_RELEASE_TABLET | Freq: Every day | ORAL | Status: DC
  Administered 2020-08-31 – 2020-09-03 (×4): 81 mg via ORAL
  Filled 2020-08-31 (×4): qty 1

## 2020-08-31 NOTE — ED Provider Notes (Signed)
[facility] EMERGENCY DEPARTMENT Provider Note   CSN: [id number] Arrival date & time: 08/31/20  1150     History Chief Complaint  Patient presents with  . Chest Pain  . Headache    Debbie Davidson is a 50 y.o. female.  50 yo F with multiple complaints.  Patient complaining of a left-sided headache that radiates to the right.  Feels like her prior headaches that usually she gets when she is on her cycle.  She is also been having abdominal pain and vomiting.  Feels like a burning to the upper portion of her abdomen.  Worse with vomiting.  Has had some difficulty eating and drinking.  She was seen in the emergency room yesterday and had a work-up performed.  Was discharged home and then represented with similar symptoms.  She has felt hot and cold but unsure if she had a fever at home.  Denies cough or congestion.  Denies diarrhea.  The history is provided by the patient.  Chest Pain Associated symptoms: headache   Associated symptoms: no dizziness, no fever, no nausea, no palpitations, no shortness of breath and no vomiting   Headache Associated symptoms: no congestion, no dizziness, no fever, no myalgias, no nausea and no vomiting   Illness Severity:  Moderate Onset quality:  Gradual Duration:  2 days Timing:  Constant Progression:  Worsening Chronicity:  New Associated symptoms: chest pain and headaches   Associated symptoms: no congestion, no fever, no myalgias, no nausea, no rhinorrhea, no shortness of breath, no vomiting and no wheezing        Past Medical History:  Diagnosis Date  . Hypertension     Patient Active Problem List   Diagnosis Date Noted  . Other and unspecified noninfectious gastroenteritis and colitis(558.9) 08/03/2012  . Hypokalemia 08/03/2012  . Abdominal pain, acute, epigastric 08/02/2012  . Nausea & vomiting 08/02/2012  . Hypertension     Past Surgical History:  Procedure Laterality Date  . TUBAL LIGATION       OB  History   No obstetric history on file.     No family history on file.  Social History   Tobacco Use  . Smoking status: Never Smoker  Substance Use Topics  . Alcohol use: No  . Drug use: No    Home Medications Prior to Admission medications   Medication Sig Start Date End Date Taking? Authorizing Provider  calcium carbonate (TUMS - DOSED IN MG ELEMENTAL CALCIUM) 500 MG chewable tablet Chew 2 tablets by mouth daily as needed for indigestion or heartburn.    [provider]  calcium carbonate (TUMS) 500 MG chewable tablet Chew 2 tablets (400 mg of elemental calcium total) by mouth 3 (three) times daily with meals. Patient not taking: Reported on 08/30/2020 08/04/18   Street, [place], PA-C  cetirizine (ZYRTEC) 10 MG tablet Take 10 mg by mouth daily as needed for allergies.    [provider]  cyclobenzaprine (FLEXERIL) 10 MG tablet Take 1 tablet (10 mg total) by mouth 2 (two) times daily as needed (muscle pain). Patient not taking: Reported on 08/30/2020 09/14/16   Janne Napoleon, NP  diclofenac (VOLTAREN) 50 MG EC tablet Take 1 tablet (50 mg total) by mouth 2 (two) times daily. Patient not taking: No sig reported 09/14/16   Janne Napoleon, NP  hydrochlorothiazide (HYDRODIURIL) 25 MG tablet Take 1 tablet (25 mg total) by mouth daily. 08/30/20 09/29/20  Liberty Handy, PA-C  HYDROcodone-acetaminophen (NORCO) 5-325 MG tablet  Take 1 tablet by mouth every 6 (six) hours as needed. Patient not taking: No sig reported 09/14/16   Janne Napoleon, NP  Multiple Vitamin (MULTIVITAMIN) tablet Take 1 tablet by mouth daily.    [provider]  omeprazole (PRILOSEC) 40 MG capsule Take 1 capsule (40 mg total) by mouth daily. 08/30/20 09/29/20  Liberty Handy, PA-C  ondansetron (ZOFRAN ODT) 4 MG disintegrating tablet Take 1 tablet (4 mg total) by mouth every 8 (eight) hours as needed for nausea or vomiting. 08/30/20   Liberty Handy, PA-C  ondansetron (ZOFRAN) 4 MG tablet Take 1  tablet (4 mg total) by mouth every 6 (six) hours. Patient not taking: Reported on 08/30/2020 10/16/15   Cheri Fowler, PA-C  promethazine (PHENERGAN) 25 MG tablet Take 1 tablet (25 mg total) by mouth every 6 (six) hours as needed for nausea or vomiting. Patient not taking: Reported on 08/30/2020 08/04/18   Street, [place], PA-C  sucralfate (CARAFATE) 1 GM/10ML suspension Take 10 mLs (1 g total) by mouth 4 (four) times daily -  with meals and at bedtime. 08/30/20   Liberty Handy, PA-C    Allergies    Oxycodone and Penicillins  Review of Systems   Review of Systems  Constitutional: Negative for chills and fever.  HENT: Negative for congestion and rhinorrhea.   Eyes: Negative for redness and visual disturbance.  Respiratory: Negative for shortness of breath and wheezing.   Cardiovascular: Positive for chest pain. Negative for palpitations.  Gastrointestinal: Negative for nausea and vomiting.  Genitourinary: Negative for dysuria and urgency.  Musculoskeletal: Negative for arthralgias and myalgias.  Skin: Negative for pallor and wound.  Neurological: Positive for headaches. Negative for dizziness.    Physical Exam Updated Vital Signs BP (!) 150/93   Pulse 61   Temp 98.7 F (37.1 C) (Oral)   Resp (!) 21   SpO2 100%   Physical Exam Vitals and nursing note reviewed.  Constitutional:      General: She is not in acute distress.    Appearance: She is well-developed. She is not diaphoretic.  HENT:     Head: Normocephalic and atraumatic.     Comments: Pain with a sensation of light touch to the left frontal region. Eyes:     Pupils: Pupils are equal, round, and reactive to light.  Cardiovascular:     Rate and Rhythm: Normal rate and regular rhythm.     Heart sounds: No murmur heard. No friction rub. No gallop.   Pulmonary:     Effort: Pulmonary effort is normal.     Breath sounds: No wheezing or rales.  Abdominal:     General: There is no distension.     Palpations: Abdomen is  soft.     Tenderness: There is abdominal tenderness (worst to the epigastrium).  Musculoskeletal:        General: No tenderness.     Cervical back: Normal range of motion and neck supple.  Skin:    General: Skin is warm and dry.  Neurological:     Mental Status: She is alert and oriented to person, place, and time.     Cranial Nerves: Cranial nerves are intact.     Sensory: Sensation is intact.     Motor: Motor function is intact.     Coordination: Coordination is intact.     Comments: Pain to the left side of her face on light touch.  Otherwise benign neurologic exam.  Psychiatric:  Behavior: Behavior normal.     ED Results / Procedures / Treatments   Labs (all labs ordered are listed, but only abnormal results are displayed) Labs Reviewed  BASIC METABOLIC PANEL - Abnormal; Notable for the following components:      Result Value   Potassium 3.3 (*)    Glucose, Bld 109 (*)    Creatinine, Ser 1.08 (*)    All other components within normal limits  TROPONIN I (HIGH SENSITIVITY) - Abnormal; Notable for the following components:   Troponin I (High Sensitivity) 22 (*)    All other components within normal limits  CBC  HEPATIC FUNCTION PANEL  LIPASE, BLOOD  I-STAT BETA HCG BLOOD, ED ([facility], [facility], [facility] ONLY)  TROPONIN I (HIGH SENSITIVITY)    EKG EKG Interpretation  Date/Time:  Monday August 31 2020 12:04:09 EDT Ventricular Rate:  70 PR Interval:  186 QRS Duration: 82 QT Interval:  378 QTC Calculation: 408 R Axis:   -22 Text Interpretation: Normal sinus rhythm with sinus arrhythmia Moderate voltage criteria for LVH, may be normal variant ( R in aVL , Cornell product ) Septal infarct , age undetermined T wave abnormality, consider lateral ischemia Abnormal ECG flipped t waves laterally  not seen on prior Otherwise no significant change Confirmed by Melene Plan [phone]) on 08/31/2020 2:15:06 PM   Radiology DG Chest Port 1 View  Result Date: 08/30/2020 CLINICAL DATA:  Shortness  of breath and chest pain nausea and vomiting. EXAM: PORTABLE CHEST 1 VIEW COMPARISON:  08/04/2018 FINDINGS: The heart size and mediastinal contours are within normal limits. Both lungs are clear. The visualized skeletal structures are unremarkable. IMPRESSION: No active disease. Electronically Signed   By: Signa Kell M.D.   On: 08/30/2020 13:32   CT Angio Chest/Abd/Pel for Dissection W and/or Wo Contrast  Result Date: 08/30/2020 CLINICAL DATA:  Chest pain and shortness of breath. Suspected aortic aneurysm EXAM: CT ANGIOGRAPHY CHEST, ABDOMEN AND PELVIS TECHNIQUE: Non-contrast CT of the chest was initially obtained. Multidetector CT imaging through the chest, abdomen and pelvis was performed using the standard protocol during bolus administration of intravenous contrast. Multiplanar reconstructed images and MIPs were obtained and reviewed to evaluate the vascular anatomy. CONTRAST:  OMNIPAQUE IOHEXOL 350 MG/ML SOLN COMPARISON:  CT abdomen pelvis 08/01/2012 FINDINGS: CTA CHEST FINDINGS Cardiovascular: Noncontrast CT of the chest demonstrates no evidence of aortic intramural hematoma. Preferential opacification of the thoracic aorta. No evidence of thoracic aortic dissection. Three vessel arch with widely patent branch vessels. Thoracic aortic measurements as follows: Sinuses of Valsalva: 4.0 cm Mid ascending thoracic aorta: 4.4 x 4.1 cm Proximal arch: 3.5 cm Distal arch: 2.8 cm Mid descending: 2.3 cm Pulmonary vasculature appears within normal limits. No central filling defect. Heart size is mildly enlarged. No pericardial effusion. Mediastinum/Nodes: No enlarged mediastinal, hilar, or axillary lymph nodes. Thyroid gland, trachea, and esophagus demonstrate no significant findings. Lungs/Pleura: Patchy dependent bibasilar opacities, favor atelectasis. No pleural effusion. No pneumothorax. Musculoskeletal: No chest wall abnormality. No acute or significant osseous findings. Review of the MIP images confirms  the above findings. CTA ABDOMEN AND PELVIS FINDINGS VASCULAR Aorta: Normal caliber aorta without aneurysm, dissection, vasculitis or significant stenosis. Scattered aortic atherosclerosis. Celiac: Patent without evidence of aneurysm, dissection, vasculitis or significant stenosis. SMA: Patent without evidence of aneurysm, dissection, vasculitis or significant stenosis. Renals: Paired bilateral renal arteries are patent without evidence of aneurysm, dissection, vasculitis, fibromuscular dysplasia or significant stenosis. IMA: Patent. Inflow: Patent without evidence of aneurysm, dissection, vasculitis or significant stenosis. Veins:  No obvious venous abnormality within the limitations of this arterial phase study. Review of the MIP images confirms the above findings. NON-VASCULAR Hepatobiliary: No focal liver abnormality is seen. No gallstones, gallbladder wall thickening, or biliary dilatation. Pancreas: Unremarkable. No pancreatic ductal dilatation or surrounding inflammatory changes. Spleen: Normal in size without focal abnormality. Adrenals/Urinary Tract: Unremarkable adrenal glands. Kidneys enhance symmetrically without focal lesion, stone, or hydronephrosis. Ureters are nondilated. Faint layering hyperdensity within the bladder lumen favored to reflect early contrast excretion. Bladder is otherwise unremarkable. Stomach/Bowel: Stomach is within normal limits. Appendix appears normal. No evidence of bowel wall thickening, distention, or inflammatory changes. Lymphatic: No abdominopelvic lymphadenopathy. Reproductive: Uterus and bilateral adnexa are unremarkable. Other: No free fluid. No abdominopelvic fluid collection. No pneumoperitoneum. No abdominal wall hernia. Musculoskeletal: No acute or significant osseous findings. Review of the MIP images confirms the above findings. IMPRESSION: 1. Ascending thoracic aortic aneurysm measuring up to 4.4 cm. Recommend annual imaging followup by CTA or MRA. This  recommendation follows 2010 ACCF/AHA/AATS/ACR/ASA/SCA/SCAI/SIR/STS/SVM Guidelines for the Diagnosis and Management of Patients with Thoracic Aortic Disease. Circulation. 2010; 121: Z610-R604: E266-e369. Aortic aneurysm NOS (ICD10-I71.9) 2. Patchy dependent bibasilar opacities, favor atelectasis. Superimposed pneumonia would be difficult to exclude. 3. Mild cardiomegaly. 4. No acute findings abdomen or pelvis. Electronically Signed   By: Duanne GuessNicholas  Plundo D.O.   On: 08/30/2020 15:36    Procedures Procedures   Medications Ordered in ED Medications  droperidol (INAPSINE) 2.5 MG/ML injection 2.5 mg (has no administration in time range)  diphenhydrAMINE (BENADRYL) injection 25 mg (has no administration in time range)    ED Course  I have reviewed the triage vital signs and the nursing notes.  Pertinent labs & imaging results that were available during my care of the patient were reviewed by me and considered in my medical decision making (see chart for details).    MDM Rules/Calculators/A&P                          50 yo F with a chief complaint of headache and burning abdominal pain.  Having vomiting as well.  Headache feels similar to her prior.  Burning in her abdomen feels like her prior reflux disease.  She was seen yesterday in the ED and had a CT angiogram of the chest abdomen pelvis that was negative.  Hypertensive on arrival though has a poor fitting cuff on her forearm.  Will attempt to treat her headache and nausea.  Will reassess.  Blood work without significant change from yesterday.  Very mild hypokalemia 3.3.  Has patient is represented with similar complaints concern for possible hypertensive urgency.  Trop is positive today though atypical symptoms. Will treat her headache here.  Give a dose of labetalol.  Patient care was signed out to Dr. Jodi MourningZavitz, please see his note for further details care in the ED.  CRITICAL CARE Performed by: Rae Roamaniel Patrick Emerald Shor   Total critical care time: 35  minutes  Critical care time was exclusive of separately billable procedures and treating other patients.  Critical care was necessary to treat or prevent imminent or life-threatening deterioration.  Critical care was time spent personally by me on the following activities: development of treatment plan with patient and/or surrogate as well as nursing, discussions with consultants, evaluation of patient's response to treatment, examination of patient, obtaining history from patient or surrogate, ordering and performing treatments and interventions, ordering and review of laboratory studies, ordering and review of radiographic studies, pulse oximetry  and re-evaluation of patient's condition.  The patients results and plan were reviewed and discussed.   Any x-rays performed were independently reviewed by myself.   Differential diagnosis were considered with the presenting HPI.  Medications  cyclobenzaprine (FLEXERIL) tablet 5 mg (has no administration in time range)  HYDROcodone-acetaminophen (NORCO/VICODIN) 5-325 MG per tablet 1 tablet (has no administration in time range)  pantoprazole (PROTONIX) EC tablet 40 mg (has no administration in time range)  sucralfate (CARAFATE) 1 GM/10ML suspension 1 g (1 g Oral Given 08/31/20 2340)  acetaminophen (TYLENOL) tablet 650 mg (has no administration in time range)  ondansetron (ZOFRAN) injection 4 mg (has no administration in time range)  enoxaparin (LOVENOX) injection 40 mg (40 mg Subcutaneous Given 08/31/20 2053)  metoprolol tartrate (LOPRESSOR) tablet 25 mg (0 mg Oral Hold 08/31/20 2339)  aspirin EC tablet 81 mg (81 mg Oral Given 08/31/20 2050)  ALPRAZolam (XANAX) tablet 0.25 mg (has no administration in time range)  morphine 2 MG/ML injection 2 mg (2 mg Intravenous Given 09/01/20 0446)  droperidol (INAPSINE) 2.5 MG/ML injection 2.5 mg (2.5 mg Intravenous Given 08/31/20 1534)  diphenhydrAMINE (BENADRYL) injection 25 mg (25 mg Intravenous Given 08/31/20  1533)  famotidine (PEPCID) IVPB 20 mg premix (0 mg Intravenous Stopped 08/31/20 1630)  labetalol (NORMODYNE) injection 20 mg (20 mg Intravenous Given 08/31/20 1536)  labetalol (NORMODYNE) injection 20 mg (20 mg Intravenous Given 08/31/20 1714)  alum & mag hydroxide-simeth (MAALOX/MYLANTA) 200-200-20 MG/5ML suspension 30 mL (30 mLs Oral Given 08/31/20 2050)    Vitals:   09/01/20 0430 09/01/20 0500 09/01/20 0600 09/01/20 0630  BP: 127/78 136/85 133/85 (!) 143/75  Pulse: (!) 56 82 (!) 58 (!) 56  Resp: 11 16 20 12   Temp:      TempSrc:      SpO2: 93% 96% 94% 95%  Weight:      Height:        Final diagnoses:  Hypertension, unspecified type  Pain of upper abdomen  Troponin level elevated    Admission/ observation were discussed with the admitting physician, patient and/or family and they are comfortable with the plan.    Final Clinical Impression(s) / ED Diagnoses Final diagnoses:  None    Rx / DC Orders ED Discharge Orders    None       , DO 09/01/20 09/03/20

## 2020-08-31 NOTE — H&P (Signed)
[facility] History and Physical   Patient: Debbie Davidson WUJ:[id number]   PCP: Burtis Junes, MD (Inactive) DOB: 1971-02-08   DOA: 08/31/2020   DOS: 08/31/2020   DOS: the patient was seen and examined on 08/31/2020 Patient coming from: The patient is coming from Home  Chief Complaint: Chest pain and abdominal pain  HPI: Debbie Davidson is a 50 y.o. female with Past medical history of essential hypertension, obesity. Patient presented with complaints of chest pain and abdominal pain. Presented to ER on 3/27 due to complaints of chest pain abdominal pain started on Saturday 3/26. Patient went to a [place] the food was not tasting good.  After going home she started having upper abdominal pain followed by nausea and multiple episodes of vomiting.  She was having some dizziness and blurry vision symptoms at the same time. After a few hours later she started having complaints of chest pain which is located on the left side. Pain felt like tightness and was 10 out of 10 in intensity and was not improving with routine medication and therefore she decided to come to the hospital on 3/27. In the ER she was evaluated with a CT chest angiogram which was unremarkable for any acute abnormality other than an aneurysm without any dissection.  EKG was unremarkable as well as troponins were negative. Patient was discharged home after symptomatic treatment. Patient asked me that at the time of the discharge her chest pain resolved but she continues to have the abdominal pain. After going home when she started taking her medication started having vomiting again early this morning on 3/28.  Her chest pain also started earlier this morning at the same time.  And she was brought to the ER. Throughout this episode patient was not able to eat or drink anything due to persistent nausea. She denies any alcohol abuse, drug abuse.  Denies any smoking.  Denies any prior similar history.  Her  chest pain is reported as tightness, reproducible and intermittent but lasting since 6 AM up until now. Her abdominal pain is continuous dull ache with sharp stabbing characteristic and intermittent crampiness.  Her pain primarily in the upper abdomen.  ED Course: EKG unremarkable.  Due to patient's persistent symptoms and recurrent ER visit patient was referred for admission.  Blood pressure was significantly high but no therapy was provided.  Blood pressure improved but remain high.  Review of Systems: as mentioned in the history of present illness.  All other systems reviewed and are negative.  Past Medical History:  Diagnosis Date  . Hypertension    Past Surgical History:  Procedure Laterality Date  . TUBAL LIGATION     Social History:  reports that she has never smoked. She does not have any smokeless tobacco history on file. She reports that she does not drink alcohol and does not use drugs.  Allergies  Allergen Reactions  . Oxycodone Nausea And Vomiting  . Penicillins Nausea And Vomiting    Did it involve swelling of the face/tongue/throat, SOB, or low BP? N Did it involve sudden or severe rash/hives, skin peeling, or any reaction on the inside of your mouth or nose? N Did you need to seek medical attention at a hospital or doctor's office? N When did it last happen?Several Years Ago If all above answers are "NO", may proceed with cephalosporin use.      Family History  Problem Relation Age of Onset  . Heart attack Mother  CABG  . Breast cancer Mother   . Heart attack Father   . Sudden Cardiac Death Father    Prior to Admission medications   Medication Sig Start Date End Date Taking? Authorizing Provider  calcium carbonate (TUMS - DOSED IN MG ELEMENTAL CALCIUM) 500 MG chewable tablet Chew 2 tablets by mouth daily as needed for indigestion or heartburn.    [provider]  calcium carbonate (TUMS) 500 MG chewable tablet Chew 2 tablets (400 mg of  elemental calcium total) by mouth 3 (three) times daily with meals. Patient not taking: Reported on 08/30/2020 08/04/18   Street, [place], PA-C  cetirizine (ZYRTEC) 10 MG tablet Take 10 mg by mouth daily as needed for allergies.    [provider]  cyclobenzaprine (FLEXERIL) 10 MG tablet Take 1 tablet (10 mg total) by mouth 2 (two) times daily as needed (muscle pain). Patient not taking: Reported on 08/30/2020 09/14/16   Janne Napoleon, NP  diclofenac (VOLTAREN) 50 MG EC tablet Take 1 tablet (50 mg total) by mouth 2 (two) times daily. Patient not taking: No sig reported 09/14/16   Janne Napoleon, NP  hydrochlorothiazide (HYDRODIURIL) 25 MG tablet Take 1 tablet (25 mg total) by mouth daily. 08/30/20 09/29/20  Liberty Handy, PA-C  HYDROcodone-acetaminophen (NORCO) 5-325 MG tablet Take 1 tablet by mouth every 6 (six) hours as needed. Patient not taking: No sig reported 09/14/16   Janne Napoleon, NP  Multiple Vitamin (MULTIVITAMIN) tablet Take 1 tablet by mouth daily.    [provider]  omeprazole (PRILOSEC) 40 MG capsule Take 1 capsule (40 mg total) by mouth daily. 08/30/20 09/29/20  Liberty Handy, PA-C  ondansetron (ZOFRAN ODT) 4 MG disintegrating tablet Take 1 tablet (4 mg total) by mouth every 8 (eight) hours as needed for nausea or vomiting. 08/30/20   Liberty Handy, PA-C  ondansetron (ZOFRAN) 4 MG tablet Take 1 tablet (4 mg total) by mouth every 6 (six) hours. Patient not taking: Reported on 08/30/2020 10/16/15   Cheri Fowler, PA-C  promethazine (PHENERGAN) 25 MG tablet Take 1 tablet (25 mg total) by mouth every 6 (six) hours as needed for nausea or vomiting. Patient not taking: Reported on 08/30/2020 08/04/18   Street, [place], PA-C  sucralfate (CARAFATE) 1 GM/10ML suspension Take 10 mLs (1 g total) by mouth 4 (four) times daily -  with meals and at bedtime. 08/30/20   Liberty Handy, PA-C    Physical Exam: Vitals:   08/31/20 1600 08/31/20 1653 08/31/20 1739 08/31/20 1740   BP: (!) 177/110 (!) 190/103    Pulse: 74 75    Resp: 14 15    Temp:      TempSrc:      SpO2: 93% 92%  97%  Weight:   86.2 kg   Height:   5\' 4"  (1.626 m)     General: alert and oriented to time, place, and person. Appear in mild distress, affect appropriate Eyes: PERRL, Conjunctiva normal ENT: Oral Mucosa Clear, moist  Neck: no JVD, no Abnormal Mass Or lumps Cardiovascular: S1 and S2 Present, no Murmur, peripheral pulses symmetrical Respiratory: good respiratory effort, Bilateral Air entry equal and Decreased, no signs of accessory muscle use, Clear to Auscultation, no Crackles, no wheezes Abdomen: Bowel Sound present, Soft and mild diffuse tenderness, no hernia Skin: no rashes  Extremities: no Pedal edema, no calf tenderness Neurologic: without any new focal findings Gait not checked due to patient safety concerns  Data Reviewed: I have personally  reviewed and interpreted labs, imaging as discussed below.  CBC: Recent Labs  Lab 08/30/20 1311 08/31/20 1200  WBC 5.9 7.9  HGB 13.5 13.3  HCT 40.0 40.2  MCV 88.9 88.5  PLT 294 295   Basic Metabolic Panel: Recent Labs  Lab 08/30/20 1311 08/31/20 1200  NA 139 136  K 3.6 3.3*  CL 105 101  CO2 27 27  GLUCOSE 111* 109*  BUN 11 14  CREATININE 1.04* 1.08*  CALCIUM 9.4 9.3   GFR: Estimated Creatinine Clearance: 66.2 mL/min (A) (by C-G formula based on SCr of 1.08 mg/dL (H)). Liver Function Tests: Recent Labs  Lab 08/30/20 1311 08/31/20 1513  AST 21 20  ALT 23 24  ALKPHOS 59 62  BILITOT 1.2 2.1*  PROT 8.1 8.7*  ALBUMIN 3.9 4.1   Recent Labs  Lab 08/30/20 1311 08/31/20 1513  LIPASE 49 42   No results for input(s): AMMONIA in the last 168 hours. Coagulation Profile: No results for input(s): INR, PROTIME in the last 168 hours. Cardiac Enzymes: No results for input(s): CKTOTAL, CKMB, CKMBINDEX, TROPONINI in the last 168 hours. BNP (last 3 results) No results for input(s): PROBNP in the last 8760  hours. HbA1C: No results for input(s): HGBA1C in the last 72 hours. CBG: No results for input(s): GLUCAP in the last 168 hours. Lipid Profile: No results for input(s): CHOL, HDL, LDLCALC, TRIG, CHOLHDL, LDLDIRECT in the last 72 hours. Thyroid Function Tests: No results for input(s): TSH, T4TOTAL, FREET4, T3FREE, THYROIDAB in the last 72 hours. Anemia Panel: No results for input(s): VITAMINB12, FOLATE, FERRITIN, TIBC, IRON, RETICCTPCT in the last 72 hours. Urine analysis:    Component Value Date/Time   COLORURINE RED (A) 08/04/2018 1722   APPEARANCEUR HAZY (A) 08/04/2018 1722   LABSPEC 1.006 08/04/2018 1722   PHURINE 6.0 08/04/2018 1722   GLUCOSEU NEGATIVE 08/04/2018 1722   HGBUR LARGE (A) 08/04/2018 1722   BILIRUBINUR NEGATIVE 08/04/2018 1722   KETONESUR NEGATIVE 08/04/2018 1722   PROTEINUR 30 (A) 08/04/2018 1722   UROBILINOGEN 0.2 12/11/2012 1831   NITRITE NEGATIVE 08/04/2018 1722   LEUKOCYTESUR MODERATE (A) 08/04/2018 1722    Radiological Exams on Admission: DG Chest Port 1 View  Result Date: 08/30/2020 CLINICAL DATA:  Shortness of breath and chest pain nausea and vomiting. EXAM: PORTABLE CHEST 1 VIEW COMPARISON:  08/04/2018 FINDINGS: The heart size and mediastinal contours are within normal limits. Both lungs are clear. The visualized skeletal structures are unremarkable. IMPRESSION: No active disease. Electronically Signed   By: Signa Kell M.D.   On: 08/30/2020 13:32   CT Angio Chest/Abd/Pel for Dissection W and/or Wo Contrast  Result Date: 08/30/2020 CLINICAL DATA:  Chest pain and shortness of breath. Suspected aortic aneurysm EXAM: CT ANGIOGRAPHY CHEST, ABDOMEN AND PELVIS TECHNIQUE: Non-contrast CT of the chest was initially obtained. Multidetector CT imaging through the chest, abdomen and pelvis was performed using the standard protocol during bolus administration of intravenous contrast. Multiplanar reconstructed images and MIPs were obtained and reviewed to evaluate  the vascular anatomy. CONTRAST:  OMNIPAQUE IOHEXOL 350 MG/ML SOLN COMPARISON:  CT abdomen pelvis 08/01/2012 FINDINGS: CTA CHEST FINDINGS Cardiovascular: Noncontrast CT of the chest demonstrates no evidence of aortic intramural hematoma. Preferential opacification of the thoracic aorta. No evidence of thoracic aortic dissection. Three vessel arch with widely patent branch vessels. Thoracic aortic measurements as follows: Sinuses of Valsalva: 4.0 cm Mid ascending thoracic aorta: 4.4 x 4.1 cm Proximal arch: 3.5 cm Distal arch: 2.8 cm Mid descending: 2.3 cm Pulmonary  vasculature appears within normal limits. No central filling defect. Heart size is mildly enlarged. No pericardial effusion. Mediastinum/Nodes: No enlarged mediastinal, hilar, or axillary lymph nodes. Thyroid gland, trachea, and esophagus demonstrate no significant findings. Lungs/Pleura: Patchy dependent bibasilar opacities, favor atelectasis. No pleural effusion. No pneumothorax. Musculoskeletal: No chest wall abnormality. No acute or significant osseous findings. Review of the MIP images confirms the above findings. CTA ABDOMEN AND PELVIS FINDINGS VASCULAR Aorta: Normal caliber aorta without aneurysm, dissection, vasculitis or significant stenosis. Scattered aortic atherosclerosis. Celiac: Patent without evidence of aneurysm, dissection, vasculitis or significant stenosis. SMA: Patent without evidence of aneurysm, dissection, vasculitis or significant stenosis. Renals: Paired bilateral renal arteries are patent without evidence of aneurysm, dissection, vasculitis, fibromuscular dysplasia or significant stenosis. IMA: Patent. Inflow: Patent without evidence of aneurysm, dissection, vasculitis or significant stenosis. Veins: No obvious venous abnormality within the limitations of this arterial phase study. Review of the MIP images confirms the above findings. NON-VASCULAR Hepatobiliary: No focal liver abnormality is seen. No gallstones, gallbladder  wall thickening, or biliary dilatation. Pancreas: Unremarkable. No pancreatic ductal dilatation or surrounding inflammatory changes. Spleen: Normal in size without focal abnormality. Adrenals/Urinary Tract: Unremarkable adrenal glands. Kidneys enhance symmetrically without focal lesion, stone, or hydronephrosis. Ureters are nondilated. Faint layering hyperdensity within the bladder lumen favored to reflect early contrast excretion. Bladder is otherwise unremarkable. Stomach/Bowel: Stomach is within normal limits. Appendix appears normal. No evidence of bowel wall thickening, distention, or inflammatory changes. Lymphatic: No abdominopelvic lymphadenopathy. Reproductive: Uterus and bilateral adnexa are unremarkable. Other: No free fluid. No abdominopelvic fluid collection. No pneumoperitoneum. No abdominal wall hernia. Musculoskeletal: No acute or significant osseous findings. Review of the MIP images confirms the above findings. IMPRESSION: 1. Ascending thoracic aortic aneurysm measuring up to 4.4 cm. Recommend annual imaging followup by CTA or MRA. This recommendation follows 2010 ACCF/AHA/AATS/ACR/ASA/SCA/SCAI/SIR/STS/SVM Guidelines for the Diagnosis and Management of Patients with Thoracic Aortic Disease. Circulation. 2010; 121: K440-N027: [id number]. Aortic aneurysm NOS (ICD10-I71.9) 2. Patchy dependent bibasilar opacities, favor atelectasis. Superimposed pneumonia would be difficult to exclude. 3. Mild cardiomegaly. 4. No acute findings abdomen or pelvis. Electronically Signed   By: Duanne GuessNicholas  Plundo D.O.   On: 08/30/2020 15:36   EKG: Independently reviewed. normal sinus rhythm, nonspecific ST and T waves changes. Echocardiogram: Ordered.  I reviewed all nursing notes, pharmacy notes, vitals, pertinent old records.  Assessment/Plan 1.  Chest pain.  Atypical. Patient presents with complaints of intermittent chest pain.  With features of both typical and atypical. Has mild worsening of her chest pain on  palpation. Reports pain is a tightness associated with high blood pressure. Troponins initially were unremarkable.  Repeat troponins are very mildly elevated likely secondary to hypertension. This appears to be demand ischemia rather than ACS. EKG shows evidence of T wave inversions but no consistent ischemic evidence. Significant family history of sudden cardiac death in father. CT chest yesterday did not mention anything on coronary calcification. Continue 81 mg aspirin. Admitted for chest pain. Check echocardiogram. We will consult cardiology.  2.  Abdominal pain. Likely gastroenteritis. Conservative measures.  Clear liquid diet. N.p.o. after midnight for in case patient requires further cardiac work-up. Treated with PPI, Carafate, pain medication and Zofran.  3.  Accelerated hypertension. Initiate Lopressor. We will also add Imdur. As needed hydralazine.  4.  Headache. Blurred vision. No focal deficit otherwise on examination. Likely associated with hypertension. We will get CT head to initiate further work-up. Serial neuro checks.  Nutrition: Clear liquid diet DVT Prophylaxis: Subcutaneous Heparin   Advance  goals of care discussion: Full code   Consults: I personally Discussed with Cardiology    Family Communication: family was present at bedside, at the time of interview.  Opportunity was given to ask question and all questions were answered satisfactorily.   Disposition:  From: Home Likely will need Home on discharge.   Author: Lynden Oxford, MD [facility] 08/31/2020 6:37 PM   To reach On-call, see care teams to locate the attending and reach out to them via [email or web address]. If 7PM-7AM, please contact night-coverage If you still have difficulty reaching the attending provider, please page the [facility] (Director on Call) for [facility] on amion for assistance.

## 2020-08-31 NOTE — ED Triage Notes (Signed)
Reported she was recently seen and discharge yesterday, here for same issues of chest pain, headache, NV and high BP.

## 2020-09-01 ENCOUNTER — Encounter (HOSPITAL_COMMUNITY): Payer: Self-pay | Admitting: Internal Medicine

## 2020-09-01 ENCOUNTER — Observation Stay (HOSPITAL_BASED_OUTPATIENT_CLINIC_OR_DEPARTMENT_OTHER): Payer: BC Managed Care – PPO

## 2020-09-01 ENCOUNTER — Other Ambulatory Visit: Payer: Self-pay | Admitting: Physician Assistant

## 2020-09-01 DIAGNOSIS — R079 Chest pain, unspecified: Secondary | ICD-10-CM | POA: Diagnosis not present

## 2020-09-01 DIAGNOSIS — I160 Hypertensive urgency: Secondary | ICD-10-CM | POA: Diagnosis not present

## 2020-09-01 DIAGNOSIS — R1010 Upper abdominal pain, unspecified: Secondary | ICD-10-CM | POA: Diagnosis not present

## 2020-09-01 DIAGNOSIS — R778 Other specified abnormalities of plasma proteins: Secondary | ICD-10-CM | POA: Diagnosis not present

## 2020-09-01 LAB — COMPREHENSIVE METABOLIC PANEL
ALT: 18 U/L (ref 0–44)
AST: 16 U/L (ref 15–41)
Albumin: 3.5 g/dL (ref 3.5–5.0)
Alkaline Phosphatase: 56 U/L (ref 38–126)
Anion gap: 9 (ref 5–15)
BUN: 19 mg/dL (ref 6–20)
CO2: 28 mmol/L (ref 22–32)
Calcium: 9.2 mg/dL (ref 8.9–10.3)
Chloride: 101 mmol/L (ref 98–111)
Creatinine, Ser: 1.4 mg/dL — ABNORMAL HIGH (ref 0.44–1.00)
GFR, Estimated: 46 mL/min — ABNORMAL LOW (ref >60)
Glucose, Bld: 102 mg/dL — ABNORMAL HIGH (ref 70–99)
Potassium: 3.1 mmol/L — ABNORMAL LOW (ref 3.5–5.1)
Sodium: 138 mmol/L (ref 135–145)
Total Bilirubin: 1.5 mg/dL — ABNORMAL HIGH (ref 0.3–1.2)
Total Protein: 7 g/dL (ref 6.5–8.1)

## 2020-09-01 LAB — CBC WITH DIFFERENTIAL/PLATELET
Abs Immature Granulocytes: 0.02 10*3/uL (ref 0.00–0.07)
Basophils Absolute: 0 10*3/uL (ref 0.0–0.1)
Basophils Relative: 0 %
Eosinophils Absolute: 0 10*3/uL (ref 0.0–0.5)
Eosinophils Relative: 0 %
HCT: 36.8 % (ref 36.0–46.0)
Hemoglobin: 12.5 g/dL (ref 12.0–15.0)
Immature Granulocytes: 0 %
Lymphocytes Relative: 48 %
Lymphs Abs: 3.2 10*3/uL (ref 0.7–4.0)
MCH: 30.3 pg (ref 26.0–34.0)
MCHC: 34 g/dL (ref 30.0–36.0)
MCV: 89.1 fL (ref 80.0–100.0)
Monocytes Absolute: 0.5 10*3/uL (ref 0.1–1.0)
Monocytes Relative: 7 %
Neutro Abs: 3 10*3/uL (ref 1.7–7.7)
Neutrophils Relative %: 45 %
Platelets: 286 10*3/uL (ref 150–400)
RBC: 4.13 MIL/uL (ref 3.87–5.11)
RDW: 14.1 % (ref 11.5–15.5)
WBC: 6.7 10*3/uL (ref 4.0–10.5)
nRBC: 0 % (ref 0.0–0.2)

## 2020-09-01 LAB — LIPID PANEL
Cholesterol: 254 mg/dL — ABNORMAL HIGH (ref 0–200)
HDL: 44 mg/dL (ref >40)
LDL Cholesterol: 191 mg/dL — ABNORMAL HIGH (ref 0–99)
Total CHOL/HDL Ratio: 5.8 RATIO
Triglycerides: 95 mg/dL (ref <150)
VLDL: 19 mg/dL (ref 0–40)

## 2020-09-01 LAB — ECHOCARDIOGRAM COMPLETE
Area-P 1/2: 2.01 cm2
Height: 64 in
S' Lateral: 2.9 cm
Weight: 3040 oz

## 2020-09-01 MED ORDER — HYDRALAZINE HCL 10 MG PO TABS: 10.0000 mg | ORAL_TABLET | Freq: Four times a day (QID) | ORAL | Status: DC | PRN

## 2020-09-01 MED ORDER — CARVEDILOL 6.25 MG PO TABS
6.2500 mg | ORAL_TABLET | Freq: Two times a day (BID) | ORAL | Status: DC
  Administered 2020-09-01 – 2020-09-03 (×4): 6.25 mg via ORAL
  Filled 2020-09-01 (×4): qty 1

## 2020-09-01 MED ORDER — HYDRALAZINE HCL 20 MG/ML IJ SOLN: 10.0000 mg | Freq: Four times a day (QID) | INTRAMUSCULAR | Status: DC | PRN

## 2020-09-01 MED ORDER — ATORVASTATIN CALCIUM 10 MG PO TABS
20.0000 mg | ORAL_TABLET | Freq: Every day | ORAL | Status: DC
  Administered 2020-09-01 – 2020-09-03 (×3): 20 mg via ORAL
  Filled 2020-09-01 (×3): qty 2

## 2020-09-01 MED ORDER — HYDRALAZINE HCL 25 MG PO TABS
25.0000 mg | ORAL_TABLET | Freq: Three times a day (TID) | ORAL | Status: DC
  Administered 2020-09-01 – 2020-09-03 (×5): 25 mg via ORAL
  Filled 2020-09-01 (×5): qty 1

## 2020-09-01 MED ORDER — POTASSIUM CHLORIDE CRYS ER 20 MEQ PO TBCR
40.0000 meq | EXTENDED_RELEASE_TABLET | Freq: Once | ORAL | Status: AC
  Administered 2020-09-01: 40 meq via ORAL
  Filled 2020-09-01: qty 2

## 2020-09-01 MED ORDER — HYDRALAZINE HCL 25 MG PO TABS
25.0000 mg | ORAL_TABLET | Freq: Four times a day (QID) | ORAL | Status: DC | PRN
  Administered 2020-09-01: 25 mg via ORAL
  Filled 2020-09-01: qty 1

## 2020-09-01 NOTE — Progress Notes (Addendum)
2D echo reviewed and is normal except for moderate LVH likely related to poorly controlled HTN.  Minimal hsTrop elevated likely related to demand ischemia in the setting of acute gastroenteritis and hypertensive urgency and not ACS.    She does have cardiac risk factors so recommend outpt coronary CTA once GI issues have resolved.  Suspect that her new lateral T wave inversions in V5 and V6 on this admit were related to hypertensive urgency in setting of significant hypokalemia.   We will set her up for coronary CTA in 2 weeks outpt with outpt office followup after her scan. Her LDL is markedly elevated and recommend start Lipitor 20mg  daily at discharge.  Continue Carvedilol for HTN and add ARB if renal function improves with resolution of gastroenteritis.

## 2020-09-01 NOTE — ED Notes (Signed)
Nurse refused to take report at this time

## 2020-09-01 NOTE — Plan of Care (Signed)
  Problem: Clinical Measurements: Goal: Diagnostic test results will improve Outcome: Progressing Goal: Cardiovascular complication will be avoided Outcome: Progressing   Problem: Coping: Goal: Level of anxiety will decrease Outcome: Progressing   

## 2020-09-01 NOTE — Progress Notes (Signed)
PROGRESS NOTE    Debbie DONALSON  WUJ:[id number] DOB: 1971/05/06 DOA: 08/31/2020 PCP: Burtis Junes, MD (Inactive)   Brief Narrative:  This 50 years old female with PMH significant for Essential hypertension, Obesity presents to the ED with chest pain and abdominal pain.  Patient reports all these symptoms started after she went to a [employer], where the food was not tasting good,  after she went home, has developed abdominal pain followed by nausea and vomiting associated with some dizziness.  Patient presented in the ED and she was discharged home after symptomatic relief.  She represented in the ED with chest pain, described chest pain as tightness,  not improving with routine medication. In the ED CT chest ruled out PE.  EKG unremarkable but due to patient's persistent symptoms,  Patient was admitted for observation.  Troponin slightly elevated.  Cardiology consulted, recommended echocardiogram which is unremarkable.  LVEF 55 to 60%.  No regional wall motion abnormalities, findings consistent with hypertension.  Slightly elevated troponin could be due to demand ischemia from hypertensive urgency.  Cardiology recommended outpatient CTA.  Assessment & Plan:   Active Problems:   Chest pain  Chest pain , Atypical: Patient presented with intermittent chest pain with features of both typical and atypical. She has  mild worsening of her chest pain on palpation. She described chest pain as tightness associated with some shortness of breath. Troponins initially were unremarkable.  Repeat troponins are very mildly elevated likely secondary to hypertension. This appears to be demand ischemia rather than ACS. EKG showedT wave inversions but no consistent ischemic evidence. Significant family history of sudden cardiac death in father. CT chest did not mention any evidence of coronary calcification. Continue 81 mg aspirin. 2D echocardiogram shows LVEF 50 to 55%.  No regional wall  motion abnormalities. Cardiology consulted recommended outpatient CTA.  Elevated troponin due to demand ischemia. LDL 191, start atorvastatin 20 mg daily  Abdominal pain> improving Likely gastroenteritis.  Patient reports abdominal pain is improving. Conservative measures.  Clear liquid diet. Treated with PPI, Carafate, pain medication and Zofran.  Essential hypertension Continue Lopressor and Imdur As needed hydralazine.  Headache> Resolved. She reported blurring of vision associated with headache. No focal deficit otherwise on examination. Likely associated with hypertension. CT head unremarkable.  No acute intracranial abnormality Serial neuro checks.   DVT prophylaxis: Heparin Code Status: Full code Family Communication: Son was at bedside  Disposition Plan:   Status is: Observation  The patient remains OBS appropriate and will d/c before 2 midnights.  Dispo: The patient is from: Home              Anticipated d/c is to: Home              Patient currently is not medically stable to d/c.   Difficult to place patient No   Consultants:   Cardiology  Procedures: Echocardiogram Antimicrobials:  Anti-infectives (From admission, onward)   None      Subjective: Patient was seen and examined at bedside.  Overnight events noted.   Patient reports mid sternal chest pain seems reproducible.  Reports chest pain has resolved, feels better  Objective: Vitals:   09/01/20 1300 09/01/20 1315 09/01/20 1330 09/01/20 1411  BP: (!) 177/81 (!) 155/104 (!) 156/84 (!) 174/90  Pulse: (!) 58 (!) 59 62 (!) 57  Resp: Temp:    98.5 F (36.9 C)  TempSrc:    Oral  SpO2: 96% 97% 94% 97%  Weight:    97.7 kg  Height:    5\' 4"  (1.626 m)    Intake/Output Summary (Last 24 hours) at 09/01/2020 1709 Last data filed at 09/01/2020 1616 Gross per 24 hour  Intake 720 ml  Output --  Net 720 ml   Filed Weights   08/31/20 1739 09/01/20 1411  Weight: 86.2 kg 97.7 kg     Examination:  General exam: Appears calm and comfortable , not in any acute distress. Respiratory system: Clear to auscultation. Respiratory effort normal. Cardiovascular system: S1 & S2 heard, RRR. No JVD, murmurs, rubs, gallops or clicks. No pedal edema. Gastrointestinal system: Abdomen is nondistended, soft and nontender. No organomegaly or masses felt.  Normal bowel sounds heard. Central nervous system: Alert and oriented. No focal neurological deficits. Extremities: Symmetric 5 x 5 power. Skin: No rashes, lesions or ulcers Psychiatry: Judgement and insight appear normal. Mood & affect appropriate.     Data Reviewed: I have personally reviewed following labs and imaging studies  CBC: Recent Labs  Lab 08/30/20 1311 08/31/20 1200 09/01/20 0259  WBC 5.9 7.9 6.7  NEUTROABS  --   --  3.0  HGB 13.5 13.3 12.5  HCT 40.0 40.2 36.8  MCV 88.9 88.5 89.1  PLT 294 295 286   Basic Metabolic Panel: Recent Labs  Lab 08/30/20 1311 08/31/20 1200 09/01/20 0259  NA 139 136 138  K 3.6 3.3* 3.1*  CL 105 101 101  CO2 27 27 28   GLUCOSE 111* 109* 102*  BUN 11 14 19   CREATININE 1.04* 1.08* 1.40*  CALCIUM 9.4 9.3 9.2   GFR: Estimated Creatinine Clearance: 54.6 mL/min (A) (by C-G formula based on SCr of 1.4 mg/dL (H)). Liver Function Tests: Recent Labs  Lab 08/30/20 1311 08/31/20 1513 09/01/20 0259  AST 21 20 16   ALT 23 24 18   ALKPHOS 59 62 56  BILITOT 1.2 2.1* 1.5*  PROT 8.1 8.7* 7.0  ALBUMIN 3.9 4.1 3.5   Recent Labs  Lab 08/30/20 1311 08/31/20 1513  LIPASE 49 42   No results for input(s): AMMONIA in the last 168 hours. Coagulation Profile: No results for input(s): INR, PROTIME in the last 168 hours. Cardiac Enzymes: No results for input(s): CKTOTAL, CKMB, CKMBINDEX, TROPONINI in the last 168 hours. BNP (last 3 results) No results for input(s): PROBNP in the last 8760 hours. HbA1C: No results for input(s): HGBA1C in the last 72 hours. CBG: No results for  input(s): GLUCAP in the last 168 hours. Lipid Profile: Recent Labs    09/01/20 0259  CHOL 254*  HDL 44  LDLCALC 191*  TRIG 95  CHOLHDL 5.8   Thyroid Function Tests: No results for input(s): TSH, T4TOTAL, FREET4, T3FREE, THYROIDAB in the last 72 hours. Anemia Panel: No results for input(s): VITAMINB12, FOLATE, FERRITIN, TIBC, IRON, RETICCTPCT in the last 72 hours. Sepsis Labs: No results for input(s): PROCALCITON, LATICACIDVEN in the last 168 hours.  Recent Results (from the past 240 hour(s))  Resp Panel by RT-PCR (Flu A&B, Covid) Nasopharyngeal Swab     Status: None   Collection Time: 08/31/20  3:39 PM   Specimen: Nasopharyngeal Swab; Nasopharyngeal(NP) swabs in vial transport medium  Result Value Ref Range Status   SARS Coronavirus 2 by RT PCR NEGATIVE NEGATIVE Final    Comment: (NOTE) SARS-CoV-2 target nucleic acids are NOT DETECTED.  The SARS-CoV-2 RNA is generally detectable in upper respiratory specimens during the acute phase of infection. The lowest concentration of SARS-CoV-2 viral copies this assay can detect is 138  copies/mL. A negative result does not preclude SARS-Cov-2 infection and should not be used as the sole basis for treatment or other patient management decisions. A negative result may occur with  improper specimen collection/handling, submission of specimen other than nasopharyngeal swab, presence of viral mutation(s) within the areas targeted by this assay, and inadequate number of viral copies(<138 copies/mL). A negative result must be combined with clinical observations, patient history, and epidemiological information. The expected result is Negative.  Fact Sheet for Patients:  BloggerCourse.comhttps://www.fda.gov/media/152166/download  Fact Sheet for Healthcare Providers:  SeriousBroker.ithttps://www.fda.gov/media/152162/download  This test is no t yet approved or cleared by the [place] FDA and  has been authorized for detection and/or diagnosis of SARS-CoV-2 by FDA  under an Emergency Use Authorization (EUA). This EUA will remain  in effect (meaning this test can be used) for the duration of the COVID-19 declaration under Section 564(b)(1) of the Act, 21 U.S.C.section 360bbb-3(b)(1), unless the authorization is terminated  or revoked sooner.       Influenza A by PCR NEGATIVE NEGATIVE Final   Influenza B by PCR NEGATIVE NEGATIVE Final    Comment: (NOTE) The Xpert Xpress SARS-CoV-2/FLU/RSV plus assay is intended as an aid in the diagnosis of influenza from Nasopharyngeal swab specimens and should not be used as a sole basis for treatment. Nasal washings and aspirates are unacceptable for Xpert Xpress SARS-CoV-2/FLU/RSV testing.  Fact Sheet for Patients: BloggerCourse.comhttps://www.fda.gov/media/152166/download  Fact Sheet for Healthcare Providers: SeriousBroker.ithttps://www.fda.gov/media/152162/download  This test is not yet approved or cleared by the [place] FDA and has been authorized for detection and/or diagnosis of SARS-CoV-2 by FDA under an Emergency Use Authorization (EUA). This EUA will remain in effect (meaning this test can be used) for the duration of the COVID-19 declaration under Section 564(b)(1) of the Act, 21 U.S.C. section 360bbb-3(b)(1), unless the authorization is terminated or revoked.  Performed at [facility], [address] [place], [place] [address]     Radiology Studies: CT HEAD WO CONTRAST  Result Date: 08/31/2020 CLINICAL DATA:  Stroke-like symptoms EXAM: CT HEAD WITHOUT CONTRAST TECHNIQUE: Contiguous axial images were obtained from the base of the skull through the vertex without intravenous contrast. COMPARISON:  08/04/2018 FINDINGS: Brain: No evidence of acute infarction, hemorrhage, hydrocephalus, extra-axial collection or mass lesion/mass effect. Vascular: No hyperdense vessel or unexpected calcification. Skull: Normal. Negative for fracture or focal lesion. Sinuses/Orbits: No acute finding. Other: None. IMPRESSION: No acute  abnormality noted. Electronically Signed   By: Alcide CleverMark  Lukens M.D.   On: 08/31/2020 19:30   DG ABD ACUTE 2+V W 1V CHEST  Result Date: 08/31/2020 CLINICAL DATA:  Abdomen pain with nausea EXAM: DG ABDOMEN ACUTE WITH 1 VIEW CHEST COMPARISON:  08/01/2012, 08/30/2020 FINDINGS: Single view chest demonstrates borderline to mild cardiomegaly. No focal opacity or pleural effusion. No pneumothorax. Supine and upright views of the abdomen demonstrate no free air beneath the diaphragm. Nonobstructed gas pattern. No pathologic calcifications. IMPRESSION: Negative abdominal radiographs. No acute cardiopulmonary disease. Borderline to mild cardiomegaly. Electronically Signed   By: Jasmine PangKim  Fujinaga M.D.   On: 08/31/2020 19:05   ECHOCARDIOGRAM COMPLETE  Result Date: 09/01/2020    ECHOCARDIOGRAM REPORT   Patient Name:   Debbie Davidson Date of Exam: 09/01/2020 Medical Rec #:  [id number]       Height:       64.0 in Accession #:    [id number]      Weight:       190.0 lb Date of Birth:  January 02, 1971  BSA:          1.914 m Patient Age:    49 years        BP:           151/103 mmHg Patient Gender: F               HR:           53 bpm. Exam Location:  Inpatient Procedure: 2D Echo, Cardiac Doppler and Color Doppler Indications:    R07.9* Chest pain, unspecified  History:        Patient has no prior history of Echocardiogram examinations.                 Risk Factors:Hypertension.  Sonographer:    Elmarie Shiley Dance Referring Phys: [id number] PRANAV M PATEL IMPRESSIONS  1. Left ventricular ejection fraction, by estimation, is 55 to 60%. The left ventricle has normal function. The left ventricle has no regional wall motion abnormalities. There is moderate concentric left ventricular hypertrophy. Left ventricular diastolic parameters are consistent with Grade I diastolic dysfunction (impaired relaxation).  2. Right ventricular systolic function is normal. The right ventricular size is normal.  3. Left atrial size was mildly dilated.  4. The mitral  valve is normal in structure. No evidence of mitral valve regurgitation. No evidence of mitral stenosis.  5. The aortic valve is tricuspid. Aortic valve regurgitation is not visualized. No aortic stenosis is present.  6. Aortic dilatation noted. There is mild dilatation of the ascending aorta, measuring 39 mm.  7. The inferior vena cava is normal in size with greater than 50% respiratory variability, suggesting right atrial pressure of 3 mmHg. FINDINGS  Left Ventricle: Left ventricular ejection fraction, by estimation, is 55 to 60%. The left ventricle has normal function. The left ventricle has no regional wall motion abnormalities. The left ventricular internal cavity size was normal in size. There is  moderate concentric left ventricular hypertrophy. Left ventricular diastolic parameters are consistent with Grade I diastolic dysfunction (impaired relaxation). Indeterminate filling pressures. Right Ventricle: The right ventricular size is normal. No increase in right ventricular wall thickness. Right ventricular systolic function is normal. Left Atrium: Left atrial size was mildly dilated. Right Atrium: Right atrial size was normal in size. Pericardium: There is no evidence of pericardial effusion. Mitral Valve: The mitral valve is normal in structure. No evidence of mitral valve regurgitation. No evidence of mitral valve stenosis. Tricuspid Valve: The tricuspid valve is normal in structure. Tricuspid valve regurgitation is not demonstrated. No evidence of tricuspid stenosis. Aortic Valve: The aortic valve is tricuspid. Aortic valve regurgitation is not visualized. No aortic stenosis is present. Pulmonic Valve: The pulmonic valve was normal in structure. Pulmonic valve regurgitation is trivial. No evidence of pulmonic stenosis. Aorta: Aortic dilatation noted. There is mild dilatation of the ascending aorta, measuring 39 mm. Venous: The inferior vena cava is normal in size with greater than 50% respiratory  variability, suggesting right atrial pressure of 3 mmHg. IAS/Shunts: No atrial level shunt detected by color flow Doppler.  LEFT VENTRICLE PLAX 2D LVIDd:         4.00 cm  Diastology LVIDs:         2.90 cm  LV e' medial:    4.13 cm/s LV PW:         1.64 cm  LV E/e' medial:  13.3 LV IVS:        1.46 cm  LV e' lateral:   4.90 cm/s LVOT diam:  2.30 cm  LV E/e' lateral: 11.2 LV SV:         78 LV SV Index:   41 LVOT Area:     4.15 cm  RIGHT VENTRICLE             IVC RV Basal diam:  2.10 cm     IVC diam: 1.60 cm RV S prime:     16.50 cm/s TAPSE (M-mode): 2.1 cm LEFT ATRIUM             Index       RIGHT ATRIUM           Index LA diam:        2.90 cm 1.52 cm/m  RA Area:     11.80 cm LA Vol (A2C):   78.0 ml 40.75 ml/m RA Volume:   22.40 ml  11.70 ml/m LA Vol (A4C):   39.2 ml 20.48 ml/m LA Biplane Vol: 56.1 ml 29.31 ml/m  AORTIC VALVE LVOT Vmax:   96.85 cm/s LVOT Vmean:  64.350 cm/s LVOT VTI:    0.188 m  AORTA Ao Root diam: 3.40 cm Ao Asc diam:  3.90 cm MITRAL VALVE MV Area (PHT): 2.01 cm    SHUNTS MV Decel Time: 377 msec    Systemic VTI:  0.19 m MV E velocity: 54.80 cm/s  Systemic Diam: 2.30 cm MV A velocity: 84.40 cm/s MV E/A ratio:  0.65 Chilton Si MD Electronically signed by Chilton Si MD Signature Date/Time: 09/01/2020/3:35:39 PM    Final    Scheduled Meds: . aspirin EC  81 mg Oral Daily  . carvedilol  6.25 mg Oral BID WC  . enoxaparin (LOVENOX) injection  40 mg Subcutaneous Q24H  . pantoprazole  40 mg Oral BID AC  . sucralfate  1 g Oral TID WC & HS   Continuous Infusions:   LOS: 0 days    Time spent: 35 mins    Yalexa Blust, MD [facility]   If 7PM-7AM, please contact night-coverage

## 2020-09-01 NOTE — Progress Notes (Signed)
Patient arrived to room 3E25 in NAD, VS stable. Family at bedside with patient.

## 2020-09-01 NOTE — Consult Note (Signed)
Cardiology Consultation:   Patient ID: Debbie Davidson MRN: [id number]; DOB: 04-30-1971  Admit date: 08/31/2020 Date of Consult: 09/01/2020  PCP:  Burtis Junes, MD (Inactive)   [facility]  Cardiologist:  No primary care provider on file. New Advanced Practice Provider:  No care team member to display Electrophysiologist:  None    Patient Profile:   Debbie Davidson is a 50 y.o. female with a hx of morbid obesity s/p wt loss, HTN (improved after wt loss), who is being seen today for the evaluation of chest pain at the request of Dr Lucianne Muss.  History of Present Illness:   Debbie Davidson is an obese AAF with no prior cardiac hx.  She has a hx of HTN, obesity and fm hx of premature CAD with her father having SCD with MI and her mom having CAD with CABG.  She has never smoked.  She presented to the ER on 3/27 with complaints of epigastric and abdominal pain, nausea and vomiting.  Her symptoms started on 3/26 when she went to eat at a [employer] and then later that evening she started to feel nauseated with abdominal and epigastric pain followed by multiple episodes of nausea and vomiting but no diarrhea.  She then got dizzy.  A few hours later she developed sharp and squeezing left sided chest pain that was severe and she came to the ER for further followup.  She denied any SOB but said she did break out in a sweat. Chest CTA showed no PE or aneurysm and hsTrop was negative and she was sent home.    Early on 3/28 she started having vomiting again after taking her meds and then started having sharp CP again and came back to the ER.  She could not eat or drink due to persistent nausea.  Her CP was constant for hours and was sharp and squeezing. She says that her facial bones also hurt.  She still has significant abdominal pain that is very tender to palpation.  Her pain is reproducible with palpation of her epigastric area.  Initial hsTrop on first EF visit were  negative but then mildly elevated this admission.  Also her BP was elevated on admission at 190/16mmHg.  Chest CTA on first admission showed no aortic dissection and no mention of coronary artery calcifications. Cardiology is now asked to consult.    Past Medical History:  Diagnosis Date  . Hypertension     Past Surgical History:  Procedure Laterality Date  . TUBAL LIGATION    . WISDOM TOOTH EXTRACTION       Home Medications:  Prior to Admission medications   Medication Sig Start Date End Date Taking? Authorizing Provider  calcium carbonate (TUMS - DOSED IN MG ELEMENTAL CALCIUM) 500 MG chewable tablet Chew 2 tablets by mouth daily as needed for indigestion or heartburn.   Yes [provider]  cetirizine (ZYRTEC) 10 MG tablet Take 10 mg by mouth daily as needed for allergies.   Yes [provider]  hydrochlorothiazide (HYDRODIURIL) 25 MG tablet Take 1 tablet (25 mg total) by mouth daily. 08/30/20 09/29/20 Yes Liberty Handy, PA-C  Multiple Vitamin (MULTIVITAMIN) tablet Take 1 tablet by mouth daily.   Yes [provider]  omeprazole (PRILOSEC) 40 MG capsule Take 1 capsule (40 mg total) by mouth daily. 08/30/20 09/29/20 Yes Sharen Heck J, PA-C  ondansetron (ZOFRAN ODT) 4 MG disintegrating tablet Take 1 tablet (4 mg total) by mouth every 8 (eight)  hours as needed for nausea or vomiting. 08/30/20  Yes Sharen HeckGibbons, Claudia J, PA-C  sucralfate (CARAFATE) 1 GM/10ML suspension Take 10 mLs (1 g total) by mouth 4 (four) times daily -  with meals and at bedtime. 08/30/20  Yes Liberty HandyGibbons, Claudia J, PA-C    Inpatient Medications: Scheduled Meds: . aspirin EC  81 mg Oral Daily  . enoxaparin (LOVENOX) injection  40 mg Subcutaneous Q24H  . metoprolol tartrate  25 mg Oral BID  . pantoprazole  40 mg Oral BID AC  . potassium chloride SA  40 mEq Oral Once  . sucralfate  1 g Oral TID WC & HS   Continuous Infusions:  PRN Meds: acetaminophen, ALPRAZolam, cyclobenzaprine,  HYDROcodone-acetaminophen, morphine injection, ondansetron (ZOFRAN) IV  Allergies:    Allergies  Allergen Reactions  . Oxycodone Nausea And Vomiting  . Penicillins Nausea And Vomiting    Did it involve swelling of the face/tongue/throat, SOB, or low BP? N Did it involve sudden or severe rash/hives, skin peeling, or any reaction on the inside of your mouth or nose? N Did you need to seek medical attention at a hospital or doctor's office? N When did it last happen?Several Years Ago If all above answers are "NO", may proceed with cephalosporin use.      Social History:   Social History   Socioeconomic History  . Marital status: Divorced    Spouse name: Not on file  . Number of children: Not on file  . Years of education: Not on file  . Highest education level: Not on file  Occupational History  . Not on file  Tobacco Use  . Smoking status: Never Smoker  . Smokeless tobacco: Not on file  Substance and Sexual Activity  . Alcohol use: No  . Drug use: No  . Sexual activity: Not on file  Other Topics Concern  . Not on file  Social History Narrative  . Not on file   Social Determinants of Health   Financial Resource Strain: Not on file  Food Insecurity: Not on file  Transportation Needs: Not on file  Physical Activity: Not on file  Stress: Not on file  Social Connections: Not on file  Intimate Partner Violence: Not on file    Family History:    Family History  Problem Relation Age of Onset  . Heart attack Mother        CABG  . Breast cancer Mother   . Heart attack Father   . Sudden Cardiac Death Father     Family Status  Relation Name Status  . Mother  Alive  . Father  Deceased     ROS:  Please see the history of present illness.  All other ROS reviewed and negative.     Physical Exam/Data:   Vitals:   09/01/20 0430 09/01/20 0500 09/01/20 0600 09/01/20 0630  BP: 127/78 136/85 133/85 (!) 143/75  Pulse: (!) 56 82 (!) 58 (!) 56  Resp: 11 16 20 12    Temp:      TempSrc:      SpO2: 93% 96% 94% 95%  Weight:      Height:       No intake or output data in the 24 hours ending 09/01/20 0902 Last 3 Weights 08/31/2020 08/02/2012  Weight (lbs) 190 lb 240 lb 8.4 oz  Weight (kg) 86.183 kg 109.1 kg     Body mass index is 32.61 kg/m.  General:  Well nourished, well developed, in no acute distress HEENT: normal Lymph:  no adenopathy Neck: no JVD Endocrine:  No thryomegaly Vascular: No carotid bruits; FA pulses 2+ bilaterally without bruits  Cardiac:  normal S1, S2; RRR; no murmur  Lungs:  clear to auscultation bilaterally, no wheezing, rhonchi or rales  Abd: very tender to palpation in the epigastrium and RUQ with palpation Ext: no edema Musculoskeletal:  No deformities, BUE and BLE strength normal and equal Skin: warm and dry  Neuro:  CNs 2-12 intact, no focal abnormalities noted Psych:  Normal affect   EKG:  The EKG was personally reviewed and demonstrates:  03/28 ECG is SR, HR 74, Lateral T wave changes are different from 03/27 and from 08/04/2018 Telemetry:  Telemetry was personally reviewed and demonstrates:  SR  Relevant CV Studies:  ECHO: ordered  Laboratory Data:  High Sensitivity Troponin:   Recent Labs  Lab 08/30/20 1311 08/30/20 1747 08/31/20 1200 08/31/20 1513 08/31/20 2038  TROPONINIHS 14 17 22* 24* 22*     Chemistry Recent Labs  Lab 08/30/20 1311 08/31/20 1200 09/01/20 0259  NA 139 136 138  K 3.6 3.3* 3.1*  CL 105 101 101  CO2 27 27 28   GLUCOSE 111* 109* 102*  BUN 11 14 19   CREATININE 1.04* 1.08* 1.40*  CALCIUM 9.4 9.3 9.2  GFRNONAA >60 >60 46*  ANIONGAP 7 8 9     Recent Labs  Lab 08/30/20 1311 08/31/20 1513 09/01/20 0259  PROT 8.1 8.7* 7.0  ALBUMIN 3.9 4.1 3.5  AST 21 20 16   ALT 23 24 18   ALKPHOS 59 62 56  BILITOT 1.2 2.1* 1.5*   Hematology Recent Labs  Lab 08/30/20 1311 08/31/20 1200 09/01/20 0259  WBC 5.9 7.9 6.7  RBC 4.50 4.54 4.13  HGB 13.5 13.3 12.5  HCT 40.0 40.2 36.8  MCV  88.9 88.5 89.1  MCH 30.0 29.3 30.3  MCHC 33.8 33.1 34.0  RDW 13.8 13.8 14.1  PLT 294 295 286   BNPNo results for input(s): BNP, PROBNP in the last 168 hours.  DDimer No results for input(s): DDIMER in the last 168 hours.   Radiology/Studies:  CT HEAD WO CONTRAST  Result Date: 08/31/2020 CLINICAL DATA:  Stroke-like symptoms EXAM: CT HEAD WITHOUT CONTRAST TECHNIQUE: Contiguous axial images were obtained from the base of the skull through the vertex without intravenous contrast. COMPARISON:  08/04/2018 FINDINGS: Brain: No evidence of acute infarction, hemorrhage, hydrocephalus, extra-axial collection or mass lesion/mass effect. Vascular: No hyperdense vessel or unexpected calcification. Skull: Normal. Negative for fracture or focal lesion. Sinuses/Orbits: No acute finding. Other: None. IMPRESSION: No acute abnormality noted. Electronically Signed   By: 09/01/20 M.D.   On: 08/31/2020 19:30   DG Chest Port 1 View  Result Date: 08/30/2020 CLINICAL DATA:  Shortness of breath and chest pain nausea and vomiting. EXAM: PORTABLE CHEST 1 VIEW COMPARISON:  08/04/2018 FINDINGS: The heart size and mediastinal contours are within normal limits. Both lungs are clear. The visualized skeletal structures are unremarkable. IMPRESSION: No active disease. Electronically Signed   By: 08/06/2018 M.D.   On: 08/30/2020 13:32   DG ABD ACUTE 2+V W 1V CHEST  Result Date: 08/31/2020 CLINICAL DATA:  Abdomen pain with nausea EXAM: DG ABDOMEN ACUTE WITH 1 VIEW CHEST COMPARISON:  08/01/2012, 08/30/2020 FINDINGS: Single view chest demonstrates borderline to mild cardiomegaly. No focal opacity or pleural effusion. No pneumothorax. Supine and upright views of the abdomen demonstrate no free air beneath the diaphragm. Nonobstructed gas pattern. No pathologic calcifications. IMPRESSION: Negative abdominal radiographs. No acute cardiopulmonary disease. Borderline to  mild cardiomegaly. Electronically Signed   By: Jasmine Pang  M.D.   On: 08/31/2020 19:05   CT Angio Chest/Abd/Pel for Dissection W and/or Wo Contrast  Result Date: 08/30/2020 CLINICAL DATA:  Chest pain and shortness of breath. Suspected aortic aneurysm EXAM: CT ANGIOGRAPHY CHEST, ABDOMEN AND PELVIS TECHNIQUE: Non-contrast CT of the chest was initially obtained. Multidetector CT imaging through the chest, abdomen and pelvis was performed using the standard protocol during bolus administration of intravenous contrast. Multiplanar reconstructed images and MIPs were obtained and reviewed to evaluate the vascular anatomy. CONTRAST:  OMNIPAQUE IOHEXOL 350 MG/ML SOLN COMPARISON:  CT abdomen pelvis 08/01/2012 FINDINGS: CTA CHEST FINDINGS Cardiovascular: Noncontrast CT of the chest demonstrates no evidence of aortic intramural hematoma. Preferential opacification of the thoracic aorta. No evidence of thoracic aortic dissection. Three vessel arch with widely patent branch vessels. Thoracic aortic measurements as follows: Sinuses of Valsalva: 4.0 cm Mid ascending thoracic aorta: 4.4 x 4.1 cm Proximal arch: 3.5 cm Distal arch: 2.8 cm Mid descending: 2.3 cm Pulmonary vasculature appears within normal limits. No central filling defect. Heart size is mildly enlarged. No pericardial effusion. Mediastinum/Nodes: No enlarged mediastinal, hilar, or axillary lymph nodes. Thyroid gland, trachea, and esophagus demonstrate no significant findings. Lungs/Pleura: Patchy dependent bibasilar opacities, favor atelectasis. No pleural effusion. No pneumothorax. Musculoskeletal: No chest wall abnormality. No acute or significant osseous findings. Review of the MIP images confirms the above findings. CTA ABDOMEN AND PELVIS FINDINGS VASCULAR Aorta: Normal caliber aorta without aneurysm, dissection, vasculitis or significant stenosis. Scattered aortic atherosclerosis. Celiac: Patent without evidence of aneurysm, dissection, vasculitis or significant stenosis. SMA: Patent without evidence of  aneurysm, dissection, vasculitis or significant stenosis. Renals: Paired bilateral renal arteries are patent without evidence of aneurysm, dissection, vasculitis, fibromuscular dysplasia or significant stenosis. IMA: Patent. Inflow: Patent without evidence of aneurysm, dissection, vasculitis or significant stenosis. Veins: No obvious venous abnormality within the limitations of this arterial phase study. Review of the MIP images confirms the above findings. NON-VASCULAR Hepatobiliary: No focal liver abnormality is seen. No gallstones, gallbladder wall thickening, or biliary dilatation. Pancreas: Unremarkable. No pancreatic ductal dilatation or surrounding inflammatory changes. Spleen: Normal in size without focal abnormality. Adrenals/Urinary Tract: Unremarkable adrenal glands. Kidneys enhance symmetrically without focal lesion, stone, or hydronephrosis. Ureters are nondilated. Faint layering hyperdensity within the bladder lumen favored to reflect early contrast excretion. Bladder is otherwise unremarkable. Stomach/Bowel: Stomach is within normal limits. Appendix appears normal. No evidence of bowel wall thickening, distention, or inflammatory changes. Lymphatic: No abdominopelvic lymphadenopathy. Reproductive: Uterus and bilateral adnexa are unremarkable. Other: No free fluid. No abdominopelvic fluid collection. No pneumoperitoneum. No abdominal wall hernia. Musculoskeletal: No acute or significant osseous findings. Review of the MIP images confirms the above findings. IMPRESSION: 1. Ascending thoracic aortic aneurysm measuring up to 4.4 cm. Recommend annual imaging followup by CTA or MRA. This recommendation follows 2010 ACCF/AHA/AATS/ACR/ASA/SCA/SCAI/SIR/STS/SVM Guidelines for the Diagnosis and Management of Patients with Thoracic Aortic Disease. Circulation. 2010; 121: Q657-Q469. Aortic aneurysm NOS (ICD10-I71.9) 2. Patchy dependent bibasilar opacities, favor atelectasis. Superimposed pneumonia would be  difficult to exclude. 3. Mild cardiomegaly. 4. No acute findings abdomen or pelvis. Electronically Signed   By: Duanne Guess D.O.   On: 08/30/2020 15:36     Assessment and Plan:   1. Chest pain -she has typical and atypical components to her pain -her pain is sharp but also squeezing in the epigastric area and upper abdomen and is nonexertional.  She is very tender to palpation in her epigastric and upper  abdomen -she has had severe nausea and vomiting which started after eating at a [employer] and suspect that she has gastroenteritis -her hsTrop was mildly elevated at 22>24>22 and flat and not c/w ACS. Most likely demand ischemia in the setting of acute GI illness as well as hypertensive urgency[ -no mention of coronary artery calcifications on chest CT -Chest CTA neg for PE or aortic dissection -2D echo pending >>if EF normal then no further workup  2.  Abdominal pain -associated with nausea, epigastric and upper abdominal pain after eating [place] food and likely has gastroenteritis -Abdominal CT normal -consider checking amylase and lipase she does not improve  3.  Hypertensive urgency -she has not been able to keep her BP meds down due to ongoing nausea and vomiting -change Lopressor to Carvedilol 6.25mg  BID for better BP control -change Imdur to Losartan if her SCr improves tomorrow -no renal artery stenosis on CTA  4.  Ascending aortic aneurysm -measured 4.4cm on Chest CTA -this will need to be followup outpt -needs aggressive control of BP   Risk Assessment/Risk Scores:         :[id number]}   HEAR Score (for undifferentiated chest pain):  HEAR Score: 4   For questions or updates, please contact [facility] Please consult [email or web address] for contact info under    Signed, Theodore Demark, PA-C  09/01/2020 9:02 AM

## 2020-09-01 NOTE — Progress Notes (Signed)
PT Cancellation Note  Patient Details Name: Debbie Davidson MRN: [id number] DOB: [birth year]   Cancelled Treatment:    Reason Eval/Treat Not Completed: Medical issues which prohibited therapy Pt in ED with chest pain and awaiting cardiology consult. Will follow up as pt medically appropriate and as schedule allows.   Cindee Salt, DPT  Acute Rehabilitation Services  Pager: [phone] Office: [phone]    Lehman Prom 09/01/2020, 11:42 AM

## 2020-09-01 NOTE — Progress Notes (Signed)
  Echocardiogram 2D Echocardiogram has been performed.  Debbie Davidson G Ziyan Schoon 09/01/2020, 1:12 PM

## 2020-09-02 DIAGNOSIS — Z79899 Other long term (current) drug therapy: Secondary | ICD-10-CM | POA: Diagnosis not present

## 2020-09-02 DIAGNOSIS — Z6832 Body mass index (BMI) 32.0-32.9, adult: Secondary | ICD-10-CM | POA: Diagnosis not present

## 2020-09-02 DIAGNOSIS — E876 Hypokalemia: Secondary | ICD-10-CM | POA: Diagnosis present

## 2020-09-02 DIAGNOSIS — Z885 Allergy status to narcotic agent status: Secondary | ICD-10-CM | POA: Diagnosis not present

## 2020-09-02 DIAGNOSIS — I160 Hypertensive urgency: Secondary | ICD-10-CM | POA: Diagnosis present

## 2020-09-02 DIAGNOSIS — K529 Noninfective gastroenteritis and colitis, unspecified: Secondary | ICD-10-CM | POA: Diagnosis present

## 2020-09-02 DIAGNOSIS — Z8249 Family history of ischemic heart disease and other diseases of the circulatory system: Secondary | ICD-10-CM | POA: Diagnosis not present

## 2020-09-02 DIAGNOSIS — I712 Thoracic aortic aneurysm, without rupture: Secondary | ICD-10-CM | POA: Diagnosis present

## 2020-09-02 DIAGNOSIS — I248 Other forms of acute ischemic heart disease: Secondary | ICD-10-CM | POA: Diagnosis present

## 2020-09-02 DIAGNOSIS — Z8241 Family history of sudden cardiac death: Secondary | ICD-10-CM | POA: Diagnosis not present

## 2020-09-02 DIAGNOSIS — Z880 Allergy status to penicillin: Secondary | ICD-10-CM | POA: Diagnosis not present

## 2020-09-02 DIAGNOSIS — R079 Chest pain, unspecified: Secondary | ICD-10-CM | POA: Diagnosis present

## 2020-09-02 DIAGNOSIS — E669 Obesity, unspecified: Secondary | ICD-10-CM | POA: Diagnosis present

## 2020-09-02 DIAGNOSIS — Z803 Family history of malignant neoplasm of breast: Secondary | ICD-10-CM | POA: Diagnosis not present

## 2020-09-02 DIAGNOSIS — Z9851 Tubal ligation status: Secondary | ICD-10-CM | POA: Diagnosis not present

## 2020-09-02 DIAGNOSIS — Z20822 Contact with and (suspected) exposure to covid-19: Secondary | ICD-10-CM | POA: Diagnosis present

## 2020-09-02 DIAGNOSIS — E785 Hyperlipidemia, unspecified: Secondary | ICD-10-CM | POA: Diagnosis present

## 2020-09-02 DIAGNOSIS — I10 Essential (primary) hypertension: Secondary | ICD-10-CM | POA: Diagnosis present

## 2020-09-02 LAB — BASIC METABOLIC PANEL
Anion gap: 8 (ref 5–15)
BUN: 24 mg/dL — ABNORMAL HIGH (ref 6–20)
CO2: 28 mmol/L (ref 22–32)
Calcium: 9.2 mg/dL (ref 8.9–10.3)
Chloride: 102 mmol/L (ref 98–111)
Creatinine, Ser: 1.3 mg/dL — ABNORMAL HIGH (ref 0.44–1.00)
GFR, Estimated: 50 mL/min — ABNORMAL LOW (ref >60)
Glucose, Bld: 103 mg/dL — ABNORMAL HIGH (ref 70–99)
Potassium: 3.8 mmol/L (ref 3.5–5.1)
Sodium: 138 mmol/L (ref 135–145)

## 2020-09-02 LAB — PHOSPHORUS: Phosphorus: 3.5 mg/dL (ref 2.5–4.6)

## 2020-09-02 LAB — MAGNESIUM: Magnesium: 2 mg/dL (ref 1.7–2.4)

## 2020-09-02 MED ORDER — SENNA 8.6 MG PO TABS
1.0000 | ORAL_TABLET | Freq: Two times a day (BID) | ORAL | Status: AC
  Administered 2020-09-02: 8.6 mg via ORAL
  Filled 2020-09-02: qty 1

## 2020-09-02 NOTE — Evaluation (Signed)
Physical Therapy Evaluation Patient Details Name: Debbie Davidson MRN: [id number] DOB: 1970-09-30 Today's Date: 09/02/2020   History of Present Illness  50 y.o. female presents to [facility] ED on 08/30/2020 with complaints of chest and abdominal pain. Pt also endorsed dizziness and blurred vision. All tests negative and pt discharged home after symptomatic treatment. Pt returns to [facility] ED on 08/31/2020 after vomiting and return of chest pain. PT found to have 4.4 cm ascending aortic aneurysm on chest CTA. PMH significant for HTN and obesity.  Clinical Impression  Pt presents to PT with mild deficits in activity tolerance, power, and functional mobility. Pt is able to perform all mobility without physical assistance but fatigues more quickly than at baseline. Pt also demonstrates slowed gait and negotiates stairs with UE support. Pt will benefit from aggressive mobilization and further acute PT POC to aide in improving activity tolerance and a return to the pt's prior level of function.    Follow Up Recommendations No PT follow up    Equipment Recommendations  None recommended by PT    Recommendations for Other Services       Precautions / Restrictions Precautions Precautions: None Restrictions Weight Bearing Restrictions: No      Mobility  Bed Mobility Overal bed mobility: Independent                  Transfers Overall transfer level: Independent                  Ambulation/Gait Ambulation/Gait assistance: Supervision Gait Distance (Feet): 500 Feet Assistive device: None Gait Pattern/deviations: Step-through pattern Gait velocity: functional Gait velocity interpretation: 1.31 - 2.62 ft/sec, indicative of limited community ambulator General Gait Details: pt with slowed step-through gait  Stairs Stairs: Yes Stairs assistance: Supervision Stair Management: One rail Right;Alternating pattern;Forwards Number of Stairs: 18    Wheelchair Mobility    Modified Rankin  (Stroke Patients Only)       Balance Overall balance assessment: No apparent balance deficits (not formally assessed)                                           Pertinent Vitals/Pain Pain Assessment: Faces Faces Pain Scale: No hurt    Home Living Family/patient expects to be discharged to:: Private residence Living Arrangements: Alone Available Help at Discharge: Family;Available PRN/intermittently Type of Home: House Home Access: Stairs to enter Entrance Stairs-Rails: None Entrance Stairs-Number of Steps: 4 Home Layout: Two level Home Equipment: Cane - single point;Crutches      Prior Function Level of Independence: Independent         Comments: works for [employer] schools     [employer]        Extremity/Trunk Assessment   Upper Extremity Assessment Upper Extremity Assessment: Overall WFL for tasks assessed    Lower Extremity Assessment Lower Extremity Assessment: Overall WFL for tasks assessed    Cervical / Trunk Assessment Cervical / Trunk Assessment: Normal  Communication   Communication: No difficulties  Cognition Arousal/Alertness: Awake/alert Behavior During Therapy: WFL for tasks assessed/performed Overall Cognitive Status: Within Functional Limits for tasks assessed                                        General Comments General comments (skin integrity, edema, etc.): VSS on  RA    Exercises     Assessment/Plan    PT Assessment Patient needs continued PT services  PT Problem List Decreased activity tolerance;Cardiopulmonary status limiting activity       PT Treatment Interventions Gait training;Therapeutic activities;Balance training;Therapeutic exercise;Patient/family education    PT Goals (Current goals can be found in the Care Plan section)  Acute Rehab PT Goals Patient Stated Goal: to go home PT Goal Formulation: With patient Time For Goal Achievement: 09/16/20 Potential to Achieve  Goals: Good Additional Goals Additional Goal #1: Pt will ambulate for >1000' with DOE rating of 1/4 or less. Additional Goal #2: Pt will score >19/24 on DGI to indicate a reduced risk for falls    Frequency Min 2X/week   Barriers to discharge        Co-evaluation               AM-PAC PT "6 Clicks" Mobility  Outcome Measure Help needed turning from your back to your side while in a flat bed without using bedrails?: None Help needed moving from lying on your back to sitting on the side of a flat bed without using bedrails?: None Help needed moving to and from a bed to a chair (including a wheelchair)?: None Help needed standing up from a chair using your arms (e.g., wheelchair or bedside chair)?: None Help needed to walk in hospital room?: A Little Help needed climbing 3-5 steps with a railing? : A Little 6 Click Score: 22    End of Session   Activity Tolerance: Patient tolerated treatment well Patient left: in bed;with call bell/phone within reach;with family/visitor present Nurse Communication: Mobility status PT Visit Diagnosis: Other abnormalities of gait and mobility (R26.89)    Time: 1049-1110 PT Time Calculation (min) (ACUTE ONLY): 21 min   Charges:   PT Evaluation $PT Eval Low Complexity: 1 Low          Arlyss Gandy, PT, DPT Acute Rehabilitation Pager: [phone]   Arlyss Gandy 09/02/2020, 12:19 PM

## 2020-09-02 NOTE — Evaluation (Signed)
Occupational Therapy Evaluation Patient Details Name: Debbie Davidson MRN: [id number] DOB: [birth year] Today's Date: 09/02/2020    History of Present Illness 50 y.o. female presents to [facility] ED on 08/30/2020 with complaints of chest and abdominal pain. Pt also endorsed dizziness and blurred vision. All tests negative and pt discharged home after symptomatic treatment. Pt returns to [facility] ED on 08/31/2020 after vomiting and return of chest pain. PT found to have 4.4 cm ascending aortic aneurysm on chest CTA. PMH significant for HTN and obesity.   Clinical Impression   Pt at this time limited as they have not had a BM in several days and increase in dizziness with positional changes. Pt was able to complete UE/LE ADLS with supervision and increase in time. Pt is able to ambulate with min guard at room level. Pt reports they can have some family support with return to home. Pt currently with functional limitations due to the deficits listed below (see OT Problem List).  Pt will benefit from skilled OT to increase their safety and independence with ADL and functional mobility for ADL to facilitate discharge to home with family assisting as needed.   BP sitting: 156/91 standing 154/97 post ambulation: 154/100      Follow Up Recommendations  Supervision - Intermittent    Equipment Recommendations       Recommendations for Other Services       Precautions / Restrictions Precautions Precautions: Fall Restrictions Weight Bearing Restrictions: No      Mobility Bed Mobility Overal bed mobility: Modified Independent (increase time for positional changes)                  Transfers Overall transfer level: Modified independent               General transfer comment: educated about positional changes    Balance Overall balance assessment: Mild deficits observed, not formally tested                                         ADL either performed or assessed with  clinical judgement   ADL Overall ADL's : Needs assistance/impaired Eating/Feeding: Independent;Sitting   Grooming: Wash/dry hands;Wash/dry face;Supervision/safety;Standing   Upper Body Bathing: Supervision/ safety;Sitting;Standing   Lower Body Bathing: Min guard;Sit to/from stand   Upper Body Dressing : Supervision/safety;Sitting;Standing   Lower Body Dressing: Supervision/safety;Cueing for safety;Cueing for sequencing;Sit to/from stand   Toilet Transfer: Min guard;Cueing for safety;Cueing for sequencing   Toileting- Architect and Hygiene: Min guard;Sit to/from stand   Tub/ Engineer, structural: Min guard   Functional mobility during ADLs: Min guard General ADL Comments: Pt reported not feeling good at this time and had slight postural say when standing     Vision Baseline Vision/History: Wears glasses       Perception     Praxis      Pertinent Vitals/Pain Pain Assessment: Faces Faces Pain Scale: Hurts a little bit Pain Location: stomache Pain Descriptors / Indicators: Discomfort Pain Intervention(s): Limited activity within patient's tolerance;Monitored during session     Hand Dominance     Extremity/Trunk Assessment Upper Extremity Assessment Upper Extremity Assessment: Overall WFL for tasks assessed   Lower Extremity Assessment Lower Extremity Assessment: Defer to PT evaluation   Cervical / Trunk Assessment Cervical / Trunk Assessment:  (per pt they have chronic pain in neck due to past injury)   Communication Communication Communication: No difficulties  Cognition Arousal/Alertness: Awake/alert Behavior During Therapy: WFL for tasks assessed/performed Overall Cognitive Status: Within Functional Limits for tasks assessed                                     General Comments  VSS on RA    Exercises     Shoulder Instructions      Home Living Family/patient expects to be discharged to:: Private residence Living Arrangements:  Alone Available Help at Discharge: Family (kids live near by) Type of Home: House Home Access: Stairs to enter Secretary/administrator of Steps: 4 Entrance Stairs-Rails: None Home Layout: Two level Alternate Level Stairs-Number of Steps: 15 Alternate Level Stairs-Rails: Right     Bathroom Toilet: Standard Bathroom Accessibility: Yes How Accessible: Accessible via walker Home Equipment: None   Additional Comments: pt reported bedroom and shower on second level of the home      Prior Functioning/Environment 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)        Comments: works for [employer]        OT Problem List: Decreased activity tolerance;Impaired balance (sitting and/or standing)      OT Treatment/Interventions: Self-care/ADL training;Energy conservation;DME and/or AE instruction;Patient/family education;Balance training    OT Goals(Current goals can be found in the care plan section) Acute Rehab OT Goals Patient Stated Goal: To be able to get stronger to go home OT Goal Formulation: With patient Time For Goal Achievement: 09/11/20 Potential to Achieve Goals: Good  OT Frequency: Min 2X/week   Barriers to D/C:            Co-evaluation              AM-PAC OT "6 Clicks" Daily Activity     Outcome Measure Help from another person eating meals?: None Help from another person taking care of personal grooming?: None Help from another person toileting, which includes using toliet, bedpan, or urinal?: None Help from another person bathing (including washing, rinsing, drying)?: None Help from another person to put on and taking off regular upper body clothing?: None Help from another person to put on and taking off regular lower body clothing?: None 6 Click Score: 24   End of Session Equipment Utilized During Treatment: Gait belt Nurse Communication: Other (comment) (positional changes and not feeling well and set  up at home)  Activity Tolerance: Other (comment) (stomache ache) Patient left: in bed;with call bell/phone within reach  OT Visit Diagnosis: Unsteadiness on feet (R26.81);Other abnormalities of gait and mobility (R26.89);Dizziness and giddiness (R42)                Time: 5176-1607 OT Time Calculation (min): 33 min Charges:  OT General Charges $OT Visit: 1 Visit OT Evaluation $OT Eval Low Complexity: 1 Low OT Treatments $Self Care/Home Management : 8-22 mins  Alphia Moh OTR/L  Acute Rehab Services  [phone] office number [phone] pager number   Alphia Moh 09/02/2020, 12:25 PM

## 2020-09-02 NOTE — Progress Notes (Signed)
1040AM Patient c/o constipation. Per MD order for soap sud enema x once and Senakot twice daily.  Procedure: Enema prepared. Hand hygiene performed. Enema administered. Patient tolerated well. Patient had BM within 10 minutes of administration.

## 2020-09-02 NOTE — Progress Notes (Signed)
PROGRESS NOTE    Debbie Davidson  ZOX:[id number] DOB: 1971/02/08 DOA: 08/31/2020 PCP: Burtis Junes, MD (Inactive)   Brief Narrative:  This 50 years old female with PMH significant for Essential hypertension, Obesity presents to the ED with chest pain and abdominal pain.  Patient reports all these symptoms started after she went to a [employer], where the food was not tasting good,  after she went home, has developed abdominal pain followed by nausea and vomiting associated with some dizziness.  Patient presented in the ED and she was discharged home after symptomatic relief.  She represented in the ED with chest pain, described chest pain as tightness,  not improving with routine medication. In the ED CT chest ruled out PE.  EKG unremarkable but due to patient's persistent symptoms,  Patient was admitted for observation.  Troponin slightly elevated.  Cardiology consulted, recommended echocardiogram which is unremarkable.  LVEF 55 to 60%.  No regional wall motion abnormalities, findings consistent with hypertension.  Slightly elevated troponin could be due to demand ischemia from hypertensive urgency.  Cardiology recommended outpatient CTA.  Assessment & Plan:   Active Problems:   Chest pain  Chest pain , Atypical: Patient presented with intermittent chest pain with features of both typical and atypical. She has  mild worsening of her chest pain on palpation. She described chest pain as tightness associated with some shortness of breath. Troponins initially were unremarkable.  Repeat troponins are very mildly elevated likely secondary to hypertension. This appears to be demand ischemia rather than ACS. EKG showedT wave inversions but no consistent ischemic evidence. Significant family history of sudden cardiac death in father. CT chest did not mention any evidence of coronary calcification. Continue 81 mg aspirin. 2D echocardiogram shows LVEF 50 to 55%.  No regional wall  motion abnormalities. Cardiology consulted recommended outpatient CTA.  Elevated troponin due to demand ischemia. LDL 191, start atorvastatin 20 mg daily  Abdominal pain> improving Likely gastroenteritis.  Patient reports abdominal pain is improving. Conservative measures.  Clear liquid diet.  Advance to soft bland diet. Treated with PPI, Carafate, pain medication and Zofran.  Essential hypertension Continue Lopressor and Imdur Hydralazine 25 mg 3 times a day.  Headache> Resolved. She reported blurring of vision associated with headache. No focal deficit otherwise on examination. Likely associated with hypertension. CT head unremarkable.  No acute intracranial abnormality Serial neuro checks.   DVT prophylaxis: Heparin Code Status: Full code Family Communication: Son was at bedside  Disposition Plan:   Status is: Inpatient  Remains inpatient appropriate because:Inpatient level of care appropriate due to severity of illness   Dispo: The patient is from: Home              Anticipated d/c is to: Home 3/31              Patient currently is not medically stable to d/c.   Difficult to place patient No    Consultants:   Cardiology  Procedures: Echocardiogram Antimicrobials:  Anti-infectives (From admission, onward)   None      Subjective: Patient was seen and examined at bedside.  Overnight events noted.  Patient reports feeling tired and fatigued, Patient  reports chest pain has resolved, feels better, still feeling nauseous.  Objective: Vitals:   09/02/20 0421 09/02/20 0707 09/02/20 1036 09/02/20 1244  BP: (!) 147/81 (!) 154/103 (!) 142/94 121/66  Pulse: 60 68  81  Resp: 12 20    Temp: 98.6 F (37 C) 98.2 F (36.8 C)  98.6 F (37 C)  TempSrc: Oral Oral  Oral  SpO2: 94% 100%  96%  Weight: 98.8 kg     Height:        Intake/Output Summary (Last 24 hours) at 09/02/2020 1425 Last data filed at 09/02/2020 1300 Gross per 24 hour  Intake 2040 ml  Output 2  ml  Net 2038 ml   Filed Weights   08/31/20 1739 09/01/20 1411 09/02/20 0421  Weight: 86.2 kg 97.7 kg 98.8 kg    Examination:  General exam: Appears calm and comfortable , not in any acute distress. Respiratory system: Clear to auscultation. Respiratory effort normal. Cardiovascular system: S1 & S2 heard, RRR. No JVD, murmurs, rubs, gallops or clicks. No pedal edema. Gastrointestinal system: Abdomen is nondistended, soft and nontender. No organomegaly or masses felt.  Normal bowel sounds heard. Central nervous system: Alert and oriented. No focal neurological deficits. Extremities: Symmetric 5 x 5 power. Skin: No rashes, lesions or ulcers Psychiatry: Judgement and insight appear normal. Mood & affect appropriate.   Data Reviewed: I have personally reviewed following labs and imaging studies  CBC: Recent Labs  Lab 08/30/20 1311 08/31/20 1200 09/01/20 0259  WBC 5.9 7.9 6.7  NEUTROABS  --   --  3.0  HGB 13.5 13.3 12.5  HCT 40.0 40.2 36.8  MCV 88.9 88.5 89.1  PLT 294 295 286   Basic Metabolic Panel: Recent Labs  Lab 08/30/20 1311 08/31/20 1200 09/01/20 0259 09/02/20 0716  NA 139 136 138 138  K 3.6 3.3* 3.1* 3.8  CL 105 101 101 102  CO2 27 27 28 28   GLUCOSE 111* 109* 102* 103*  BUN 11 14 19  24*  CREATININE 1.04* 1.08* 1.40* 1.30*  CALCIUM 9.4 9.3 9.2 9.2  MG  --   --   --  2.0  PHOS  --   --   --  3.5   GFR: Estimated Creatinine Clearance: 59.1 mL/min (A) (by C-G formula based on SCr of 1.3 mg/dL (H)). Liver Function Tests: Recent Labs  Lab 08/30/20 1311 08/31/20 1513 09/01/20 0259  AST 21 20 16   ALT 23 24 18   ALKPHOS 59 62 56  BILITOT 1.2 2.1* 1.5*  PROT 8.1 8.7* 7.0  ALBUMIN 3.9 4.1 3.5   Recent Labs  Lab 08/30/20 1311 08/31/20 1513  LIPASE 49 42   No results for input(s): AMMONIA in the last 168 hours. Coagulation Profile: No results for input(s): INR, PROTIME in the last 168 hours. Cardiac Enzymes: No results for input(s): CKTOTAL, CKMB,  CKMBINDEX, TROPONINI in the last 168 hours. BNP (last 3 results) No results for input(s): PROBNP in the last 8760 hours. HbA1C: No results for input(s): HGBA1C in the last 72 hours. CBG: No results for input(s): GLUCAP in the last 168 hours. Lipid Profile: Recent Labs    09/01/20 0259  CHOL 254*  HDL 44  LDLCALC 191*  TRIG 95  CHOLHDL 5.8   Thyroid Function Tests: No results for input(s): TSH, T4TOTAL, FREET4, T3FREE, THYROIDAB in the last 72 hours. Anemia Panel: No results for input(s): VITAMINB12, FOLATE, FERRITIN, TIBC, IRON, RETICCTPCT in the last 72 hours. Sepsis Labs: No results for input(s): PROCALCITON, LATICACIDVEN in the last 168 hours.  Recent Results (from the past 240 hour(s))  Resp Panel by RT-PCR (Flu A&B, Covid) Nasopharyngeal Swab     Status: None   Collection Time: 08/31/20  3:39 PM   Specimen: Nasopharyngeal Swab; Nasopharyngeal(NP) swabs in vial transport medium  Result Value Ref Range Status  SARS Coronavirus 2 by RT PCR NEGATIVE NEGATIVE Final    Comment: (NOTE) SARS-CoV-2 target nucleic acids are NOT DETECTED.  The SARS-CoV-2 RNA is generally detectable in upper respiratory specimens during the acute phase of infection. The lowest concentration of SARS-CoV-2 viral copies this assay can detect is 138 copies/mL. A negative result does not preclude SARS-Cov-2 infection and should not be used as the sole basis for treatment or other patient management decisions. A negative result may occur with  improper specimen collection/handling, submission of specimen other than nasopharyngeal swab, presence of viral mutation(s) within the areas targeted by this assay, and inadequate number of viral copies(<138 copies/mL). A negative result must be combined with clinical observations, patient history, and epidemiological information. The expected result is Negative.  Fact Sheet for Patients:  BloggerCourse.com  Fact Sheet for Healthcare  Providers:  SeriousBroker.it  This test is no t yet approved or cleared by the [place] FDA and  has been authorized for detection and/or diagnosis of SARS-CoV-2 by FDA under an Emergency Use Authorization (EUA). This EUA will remain  in effect (meaning this test can be used) for the duration of the COVID-19 declaration under Section 564(b)(1) of the Act, 21 U.S.C.section 360bbb-3(b)(1), unless the authorization is terminated  or revoked sooner.       Influenza A by PCR NEGATIVE NEGATIVE Final   Influenza B by PCR NEGATIVE NEGATIVE Final    Comment: (NOTE) The Xpert Xpress SARS-CoV-2/FLU/RSV plus assay is intended as an aid in the diagnosis of influenza from Nasopharyngeal swab specimens and should not be used as a sole basis for treatment. Nasal washings and aspirates are unacceptable for Xpert Xpress SARS-CoV-2/FLU/RSV testing.  Fact Sheet for Patients: BloggerCourse.com  Fact Sheet for Healthcare Providers: SeriousBroker.it  This test is not yet approved or cleared by the [place] FDA and has been authorized for detection and/or diagnosis of SARS-CoV-2 by FDA under an Emergency Use Authorization (EUA). This EUA will remain in effect (meaning this test can be used) for the duration of the COVID-19 declaration under Section 564(b)(1) of the Act, 21 U.S.C. section 360bbb-3(b)(1), unless the authorization is terminated or revoked.  Performed at [facility], [address], [place], [place] [address]     Radiology Studies: CT HEAD WO CONTRAST  Result Date: 08/31/2020 CLINICAL DATA:  Stroke-like symptoms EXAM: CT HEAD WITHOUT CONTRAST TECHNIQUE: Contiguous axial images were obtained from the base of the skull through the vertex without intravenous contrast. COMPARISON:  08/04/2018 FINDINGS: Brain: No evidence of acute infarction, hemorrhage, hydrocephalus, extra-axial collection or mass  lesion/mass effect. Vascular: No hyperdense vessel or unexpected calcification. Skull: Normal. Negative for fracture or focal lesion. Sinuses/Orbits: No acute finding. Other: None. IMPRESSION: No acute abnormality noted. Electronically Signed   By: Alcide Clever M.D.   On: 08/31/2020 19:30   DG ABD ACUTE 2+V W 1V CHEST  Result Date: 08/31/2020 CLINICAL DATA:  Abdomen pain with nausea EXAM: DG ABDOMEN ACUTE WITH 1 VIEW CHEST COMPARISON:  08/01/2012, 08/30/2020 FINDINGS: Single view chest demonstrates borderline to mild cardiomegaly. No focal opacity or pleural effusion. No pneumothorax. Supine and upright views of the abdomen demonstrate no free air beneath the diaphragm. Nonobstructed gas pattern. No pathologic calcifications. IMPRESSION: Negative abdominal radiographs. No acute cardiopulmonary disease. Borderline to mild cardiomegaly. Electronically Signed   By: Jasmine Pang M.D.   On: 08/31/2020 19:05   ECHOCARDIOGRAM COMPLETE  Result Date: 09/01/2020    ECHOCARDIOGRAM REPORT   Patient Name:   Debbie Davidson  Date of Exam: 09/01/2020 Medical Rec #:  [id number]       Height:       64.0 in Accession #:    [id number]      Weight:       190.0 lb Date of Birth:  [birth year]       BSA:          1.914 m Patient Age:    49 years        BP:           151/103 mmHg Patient Gender: F               HR:           53 bpm. Exam Location:  Inpatient Procedure: 2D Echo, Cardiac Doppler and Color Doppler Indications:    R07.9* Chest pain, unspecified  History:        Patient has no prior history of Echocardiogram examinations.                 Risk Factors:Hypertension.  Sonographer:    Elmarie Shiley Dance Referring Phys: [id number] PRANAV M PATEL IMPRESSIONS  1. Left ventricular ejection fraction, by estimation, is 55 to 60%. The left ventricle has normal function. The left ventricle has no regional wall motion abnormalities. There is moderate concentric left ventricular hypertrophy. Left ventricular diastolic parameters are consistent  with Grade I diastolic dysfunction (impaired relaxation).  2. Right ventricular systolic function is normal. The right ventricular size is normal.  3. Left atrial size was mildly dilated.  4. The mitral valve is normal in structure. No evidence of mitral valve regurgitation. No evidence of mitral stenosis.  5. The aortic valve is tricuspid. Aortic valve regurgitation is not visualized. No aortic stenosis is present.  6. Aortic dilatation noted. There is mild dilatation of the ascending aorta, measuring 39 mm.  7. The inferior vena cava is normal in size with greater than 50% respiratory variability, suggesting right atrial pressure of 3 mmHg. FINDINGS  Left Ventricle: Left ventricular ejection fraction, by estimation, is 55 to 60%. The left ventricle has normal function. The left ventricle has no regional wall motion abnormalities. The left ventricular internal cavity size was normal in size. There is  moderate concentric left ventricular hypertrophy. Left ventricular diastolic parameters are consistent with Grade I diastolic dysfunction (impaired relaxation). Indeterminate filling pressures. Right Ventricle: The right ventricular size is normal. No increase in right ventricular wall thickness. Right ventricular systolic function is normal. Left Atrium: Left atrial size was mildly dilated. Right Atrium: Right atrial size was normal in size. Pericardium: There is no evidence of pericardial effusion. Mitral Valve: The mitral valve is normal in structure. No evidence of mitral valve regurgitation. No evidence of mitral valve stenosis. Tricuspid Valve: The tricuspid valve is normal in structure. Tricuspid valve regurgitation is not demonstrated. No evidence of tricuspid stenosis. Aortic Valve: The aortic valve is tricuspid. Aortic valve regurgitation is not visualized. No aortic stenosis is present. Pulmonic Valve: The pulmonic valve was normal in structure. Pulmonic valve regurgitation is trivial. No evidence of  pulmonic stenosis. Aorta: Aortic dilatation noted. There is mild dilatation of the ascending aorta, measuring 39 mm. Venous: The inferior vena cava is normal in size with greater than 50% respiratory variability, suggesting right atrial pressure of 3 mmHg. IAS/Shunts: No atrial level shunt detected by color flow Doppler.  LEFT VENTRICLE PLAX 2D LVIDd:         4.00 cm  Diastology LVIDs:  2.90 cm  LV e' medial:    4.13 cm/s LV PW:         1.64 cm  LV E/e' medial:  13.3 LV IVS:        1.46 cm  LV e' lateral:   4.90 cm/s LVOT diam:     2.30 cm  LV E/e' lateral: 11.2 LV SV:         78 LV SV Index:   41 LVOT Area:     4.15 cm  RIGHT VENTRICLE             IVC RV Basal diam:  2.10 cm     IVC diam: 1.60 cm RV S prime:     16.50 cm/s TAPSE (M-mode): 2.1 cm LEFT ATRIUM             Index       RIGHT ATRIUM           Index LA diam:        2.90 cm 1.52 cm/m  RA Area:     11.80 cm LA Vol (A2C):   78.0 ml 40.75 ml/m RA Volume:   22.40 ml  11.70 ml/m LA Vol (A4C):   39.2 ml 20.48 ml/m LA Biplane Vol: 56.1 ml 29.31 ml/m  AORTIC VALVE LVOT Vmax:   96.85 cm/s LVOT Vmean:  64.350 cm/s LVOT VTI:    0.188 m  AORTA Ao Root diam: 3.40 cm Ao Asc diam:  3.90 cm MITRAL VALVE MV Area (PHT): 2.01 cm    SHUNTS MV Decel Time: 377 msec    Systemic VTI:  0.19 m MV E velocity: 54.80 cm/s  Systemic Diam: 2.30 cm MV A velocity: 84.40 cm/s MV E/A ratio:  0.65 Chilton Siiffany [place] MD Electronically signed by Chilton Siiffany [place] MD Signature Date/Time: 09/01/2020/3:35:39 PM    Final    Scheduled Meds: . aspirin EC  81 mg Oral Daily  . atorvastatin  20 mg Oral Daily  . carvedilol  6.25 mg Oral BID WC  . enoxaparin (LOVENOX) injection  40 mg Subcutaneous Q24H  . hydrALAZINE  25 mg Oral Q8H  . pantoprazole  40 mg Oral BID AC  . senna  1 tablet Oral BID  . sucralfate  1 g Oral TID WC & HS   Continuous Infusions:   LOS: 0 days    Time spent: 25 mins    Cipriano BunkerPARDEEP Patrici Minnis, MD [facility]   If 7PM-7AM, please contact  night-coverage

## 2020-09-03 LAB — BASIC METABOLIC PANEL
Anion gap: 6 (ref 5–15)
BUN: 21 mg/dL — ABNORMAL HIGH (ref 6–20)
CO2: 29 mmol/L (ref 22–32)
Calcium: 9.1 mg/dL (ref 8.9–10.3)
Chloride: 103 mmol/L (ref 98–111)
Creatinine, Ser: 1.35 mg/dL — ABNORMAL HIGH (ref 0.44–1.00)
GFR, Estimated: 48 mL/min — ABNORMAL LOW (ref >60)
Glucose, Bld: 105 mg/dL — ABNORMAL HIGH (ref 70–99)
Potassium: 3.7 mmol/L (ref 3.5–5.1)
Sodium: 138 mmol/L (ref 135–145)

## 2020-09-03 MED ORDER — CARVEDILOL 6.25 MG PO TABS: 6.2500 mg | ORAL_TABLET | Freq: Two times a day (BID) | ORAL | 1 refills | Status: DC

## 2020-09-03 MED ORDER — HYDROCODONE-ACETAMINOPHEN 5-325 MG PO TABS: 1.0000 | ORAL_TABLET | Freq: Four times a day (QID) | ORAL | 0 refills | Status: DC | PRN

## 2020-09-03 MED ORDER — ATORVASTATIN CALCIUM 20 MG PO TABS: 20.0000 mg | ORAL_TABLET | Freq: Every day | ORAL | 1 refills | Status: AC

## 2020-09-03 MED ORDER — HYDRALAZINE HCL 25 MG PO TABS: 25.0000 mg | ORAL_TABLET | Freq: Three times a day (TID) | ORAL | 0 refills | Status: DC

## 2020-09-03 NOTE — Discharge Summary (Signed)
Physician Discharge Summary  Debbie Davidson AVW:[id number] DOB: 1970/11/26 DOA: 08/31/2020  PCP: Burtis Junes, MD (Inactive)  Admit date: 08/31/2020   Discharge date: 09/03/2020  Admitted From:  Home Disposition:  Home  Recommendations for Outpatient Follow-up:  1. Follow up with PCP in 1-2 weeks. 2. Please obtain BMP/CBC in one week. 3. Advised to follow-up with cardiology for Coronary CT to rule out coronary artery disease.   4. Advised to take Lipitor 20 mg daily for hyperlipidemia.   5. Advised to take Coreg 6.25 mg twice daily and hydralazine 25 mg 3 times daily for blood pressure control.   6. We will request primary care physician to start ACE inhibitors or ARB  after renal functions improves.  Home Health: None. Equipment/Devices: None.  Discharge Condition: Stable CODE STATUS:Full code Diet recommendation: Heart Healthy   Brief [name] Course: This 50 years old female with PMH significant for Essential hypertension, Obesity presents to the ED with chest pain and abdominal pain for 3 days. Patient reports all these symptoms started after she went to a [employer], where the food was not tasting good,  after she went home, has developed abdominal pain followed by nausea and vomiting associated with some dizziness.  Patient presented in the ED and she was discharged home after symptomatic relief. She represented in the ED with chest pain, described chest pain as tightness, not improving with routine medication. In the ED CTA chest ruled out PE.  EKG unremarkable but due to patient's persistent symptoms,  Patient was admitted for observation.  Troponin slightly elevated. Cardiology consulted, recommended echocardiogram which was unremarkable.  LVEF 55 to 60%.  No regional wall motion abnormalities,LVH findings consistent with hypertension.  Slightly elevated troponin could be due to demand ischemia from hypertensive urgency.  Cardiology recommended outpatient  coronary CT to rule out coronary artery disease.  Patient seems improving.  Patient was given IV fluids,  renal function has slightly improved.  Patient is ambulated in the hallway, denies any chest pain and wants to be discharged.  Blood pressure has improved with adjustment in blood pressure medication.  Patient is being discharged home on Coreg 6.25 mg twice daily, hydralazine 25 mg 3 times daily and Lipitor 20 mg daily for hyperlipidemia.  Patient should be started on ARB or ACE inhibitor once renal functions improves.  She was managed for below problems.   Discharge Diagnoses:  Active Problems:   Chest pain  Chest pain , Atypical: Patient presented with intermittent chest pain with features of both typical and atypical. She has mild worsening of her chest pain on palpation. She described chest pain as tightness associated with some shortness of breath. Troponins initially were unremarkable. Repeat troponins are very mildly elevated likely secondary to hypertension. This appears to be demand ischemia rather than ACS. EKG showed T wave inversions but no consistent ischemic evidence. Significant family history of sudden cardiac death in father. CT chest did not mention any evidence of coronary calcification. Continue 81 mg aspirin. 2D echocardiogram shows LVEF 50 to 55%.  No regional wall motion abnormalities. Cardiology consulted recommended outpatient CT coronary.  Elevated troponin due to demand ischemia. LDL 191, start atorvastatin 20 mg daily  Abdominal pain> Resolved. Likely gastroenteritis.  Patient reports abdominal pain is improving. Conservative measures. Clear liquid diet.  Advance to soft bland diet. Treated with PPI, Carafate, pain medication and Zofran.  Essential hypertension Continue Coreg and Imdur Hydralazine 25 mg 3 times a day.  Headache> Resolved. She reported blurring  of vision associated with headache. No focal deficit otherwise on examination. Likely  associated with hypertension. CT head unremarkable.  No acute intracranial abnormality Serial neuro checks.  Discharge Instructions  Discharge Instructions    Call MD for:  difficulty breathing, headache or visual disturbances   Complete by: As directed    Call MD for:  persistant dizziness or light-headedness   Complete by: As directed    Call MD for:  persistant nausea and vomiting   Complete by: As directed    Diet - low sodium heart healthy   Complete by: As directed    Diet Carb Modified   Complete by: As directed    Discharge instructions   Complete by: As directed    Advised to follow-up with primary care physician in 1 week.  Advised to follow-up with cardiology for CTA to rule out coronary artery disease.  Advised to take Lipitor 20 mg daily for hyperlipidemia.  Advised to take Coreg 6.25 mg twice daily and hydralazine 25 mg 3 times daily for blood pressure control.  We will request primary care physician to start ACE inhibitors or ARB is after renal functions improves.   Increase activity slowly   Complete by: As directed      Allergies as of 09/03/2020      Reactions   Oxycodone Nausea And Vomiting   Penicillins Nausea And Vomiting   Did it involve swelling of the face/tongue/throat, SOB, or low BP? N Did it involve sudden or severe rash/hives, skin peeling, or any reaction on the inside of your mouth or nose? N Did you need to seek medical attention at a hospital or doctor's office? N When did it last happen?Several Years Ago If all above answers are "NO", may proceed with cephalosporin use.      Medication List    STOP taking these medications   hydrochlorothiazide 25 MG tablet Commonly known as: HYDRODIURIL     TAKE these medications   atorvastatin 20 MG tablet Commonly known as: LIPITOR Take 1 tablet (20 mg total) by mouth daily.   calcium carbonate 500 MG chewable tablet Commonly known as: TUMS - dosed in mg elemental calcium Chew 2 tablets by  mouth daily as needed for indigestion or heartburn.   carvedilol 6.25 MG tablet Commonly known as: COREG Take 1 tablet (6.25 mg total) by mouth 2 (two) times daily with a meal.   cetirizine 10 MG tablet Commonly known as: ZYRTEC Take 10 mg by mouth daily as needed for allergies.   hydrALAZINE 25 MG tablet Commonly known as: APRESOLINE Take 1 tablet (25 mg total) by mouth every 8 (eight) hours.   HYDROcodone-acetaminophen 5-325 MG tablet Commonly known as: Norco Take 1 tablet by mouth every 6 (six) hours as needed.   multivitamin tablet Take 1 tablet by mouth daily.   omeprazole 40 MG capsule Commonly known as: PRILOSEC Take 1 capsule (40 mg total) by mouth daily.   ondansetron 4 MG disintegrating tablet Commonly known as: Zofran ODT Take 1 tablet (4 mg total) by mouth every 8 (eight) hours as needed for nausea or vomiting.   sucralfate 1 GM/10ML suspension Commonly known as: Carafate Take 10 mLs (1 g total) by mouth 4 (four) times daily -  with meals and at bedtime.       Follow-up Information    Tereso Newcomer T, PA-C Follow up on 09/29/2020.   Specialties: Cardiology, Physician Assistant Contact information: [address] [place] [address] [phone]  Blount, Viviana Simpler, MD Follow up in 1 week(s).   Specialty: Family Medicine Contact information: [address] [address] [place] [address] [phone]        Quintella Reichert, MD .   Specialty: Cardiology Contact information: [address] [place] [address] [phone]              Allergies  Allergen Reactions  . Oxycodone Nausea And Vomiting  . Penicillins Nausea And Vomiting    Did it involve swelling of the face/tongue/throat, SOB, or low BP? N Did it involve sudden or severe rash/hives, skin peeling, or any reaction on the inside of your mouth or nose? N Did you need to seek medical attention at a hospital or doctor's office?  N When did it last happen?Several Years Ago If all above answers are "NO", may proceed with cephalosporin use.      Consultations:  Cardiology   Procedures/Studies: CT HEAD WO CONTRAST  Result Date: 08/31/2020 CLINICAL DATA:  Stroke-like symptoms EXAM: CT HEAD WITHOUT CONTRAST TECHNIQUE: Contiguous axial images were obtained from the base of the skull through the vertex without intravenous contrast. COMPARISON:  08/04/2018 FINDINGS: Brain: No evidence of acute infarction, hemorrhage, hydrocephalus, extra-axial collection or mass lesion/mass effect. Vascular: No hyperdense vessel or unexpected calcification. Skull: Normal. Negative for fracture or focal lesion. Sinuses/Orbits: No acute finding. Other: None. IMPRESSION: No acute abnormality noted. Electronically Signed   By: Alcide Clever M.D.   On: 08/31/2020 19:30   DG Chest Port 1 View  Result Date: 08/30/2020 CLINICAL DATA:  Shortness of breath and chest pain nausea and vomiting. EXAM: PORTABLE CHEST 1 VIEW COMPARISON:  08/04/2018 FINDINGS: The heart size and mediastinal contours are within normal limits. Both lungs are clear. The visualized skeletal structures are unremarkable. IMPRESSION: No active disease. Electronically Signed   By: Signa Kell M.D.   On: 08/30/2020 13:32   DG ABD ACUTE 2+V W 1V CHEST  Result Date: 08/31/2020 CLINICAL DATA:  Abdomen pain with nausea EXAM: DG ABDOMEN ACUTE WITH 1 VIEW CHEST COMPARISON:  08/01/2012, 08/30/2020 FINDINGS: Single view chest demonstrates borderline to mild cardiomegaly. No focal opacity or pleural effusion. No pneumothorax. Supine and upright views of the abdomen demonstrate no free air beneath the diaphragm. Nonobstructed gas pattern. No pathologic calcifications. IMPRESSION: Negative abdominal radiographs. No acute cardiopulmonary disease. Borderline to mild cardiomegaly. Electronically Signed   By: Jasmine Pang M.D.   On: 08/31/2020 19:05   ECHOCARDIOGRAM COMPLETE  Result Date:  09/01/2020    ECHOCARDIOGRAM REPORT   Patient Name:   Debbie Davidson Date of Exam: 09/01/2020 Medical Rec #:  [id number]       Height:       64.0 in Accession #:    [id number]      Weight:       190.0 lb Date of Birth:  [birth year]       BSA:          1.914 m Patient Age:    49 years        BP:           151/103 mmHg Patient Gender: F               HR:           53 bpm. Exam Location:  Inpatient Procedure: 2D Echo, Cardiac Doppler and Color Doppler Indications:    R07.9* Chest pain, unspecified  History:        Patient  has no prior history of Echocardiogram examinations.                 Risk Factors:Hypertension.  Sonographer:    Elmarie Shiley Dance Referring Phys: [id number] PRANAV M PATEL IMPRESSIONS  1. Left ventricular ejection fraction, by estimation, is 55 to 60%. The left ventricle has normal function. The left ventricle has no regional wall motion abnormalities. There is moderate concentric left ventricular hypertrophy. Left ventricular diastolic parameters are consistent with Grade I diastolic dysfunction (impaired relaxation).  2. Right ventricular systolic function is normal. The right ventricular size is normal.  3. Left atrial size was mildly dilated.  4. The mitral valve is normal in structure. No evidence of mitral valve regurgitation. No evidence of mitral stenosis.  5. The aortic valve is tricuspid. Aortic valve regurgitation is not visualized. No aortic stenosis is present.  6. Aortic dilatation noted. There is mild dilatation of the ascending aorta, measuring 39 mm.  7. The inferior vena cava is normal in size with greater than 50% respiratory variability, suggesting right atrial pressure of 3 mmHg. FINDINGS  Left Ventricle: Left ventricular ejection fraction, by estimation, is 55 to 60%. The left ventricle has normal function. The left ventricle has no regional wall motion abnormalities. The left ventricular internal cavity size was normal in size. There is  moderate concentric left ventricular hypertrophy.  Left ventricular diastolic parameters are consistent with Grade I diastolic dysfunction (impaired relaxation). Indeterminate filling pressures. Right Ventricle: The right ventricular size is normal. No increase in right ventricular wall thickness. Right ventricular systolic function is normal. Left Atrium: Left atrial size was mildly dilated. Right Atrium: Right atrial size was normal in size. Pericardium: There is no evidence of pericardial effusion. Mitral Valve: The mitral valve is normal in structure. No evidence of mitral valve regurgitation. No evidence of mitral valve stenosis. Tricuspid Valve: The tricuspid valve is normal in structure. Tricuspid valve regurgitation is not demonstrated. No evidence of tricuspid stenosis. Aortic Valve: The aortic valve is tricuspid. Aortic valve regurgitation is not visualized. No aortic stenosis is present. Pulmonic Valve: The pulmonic valve was normal in structure. Pulmonic valve regurgitation is trivial. No evidence of pulmonic stenosis. Aorta: Aortic dilatation noted. There is mild dilatation of the ascending aorta, measuring 39 mm. Venous: The inferior vena cava is normal in size with greater than 50% respiratory variability, suggesting right atrial pressure of 3 mmHg. IAS/Shunts: No atrial level shunt detected by color flow Doppler.  LEFT VENTRICLE PLAX 2D LVIDd:         4.00 cm  Diastology LVIDs:         2.90 cm  LV e' medial:    4.13 cm/s LV PW:         1.64 cm  LV E/e' medial:  13.3 LV IVS:        1.46 cm  LV e' lateral:   4.90 cm/s LVOT diam:     2.30 cm  LV E/e' lateral: 11.2 LV SV:         78 LV SV Index:   41 LVOT Area:     4.15 cm  RIGHT VENTRICLE             IVC RV Basal diam:  2.10 cm     IVC diam: 1.60 cm RV S prime:     16.50 cm/s TAPSE (M-mode): 2.1 cm LEFT ATRIUM             Index       RIGHT ATRIUM  Index LA diam:        2.90 cm 1.52 cm/m  RA Area:     11.80 cm LA Vol (A2C):   78.0 ml 40.75 ml/m RA Volume:   22.40 ml  11.70 ml/m LA Vol  (A4C):   39.2 ml 20.48 ml/m LA Biplane Vol: 56.1 ml 29.31 ml/m  AORTIC VALVE LVOT Vmax:   96.85 cm/s LVOT Vmean:  64.350 cm/s LVOT VTI:    0.188 m  AORTA Ao Root diam: 3.40 cm Ao Asc diam:  3.90 cm MITRAL VALVE MV Area (PHT): 2.01 cm    SHUNTS MV Decel Time: 377 msec    Systemic VTI:  0.19 m MV E velocity: 54.80 cm/s  Systemic Diam: 2.30 cm MV A velocity: 84.40 cm/s MV E/A ratio:  0.65 Chilton Siiffany [place] MD Electronically signed by Chilton Siiffany [place] MD Signature Date/Time: 09/01/2020/3:35:39 PM    Final    CT Angio Chest/Abd/Pel for Dissection W and/or Wo Contrast  Result Date: 08/30/2020 CLINICAL DATA:  Chest pain and shortness of breath. Suspected aortic aneurysm EXAM: CT ANGIOGRAPHY CHEST, ABDOMEN AND PELVIS TECHNIQUE: Non-contrast CT of the chest was initially obtained. Multidetector CT imaging through the chest, abdomen and pelvis was performed using the standard protocol during bolus administration of intravenous contrast. Multiplanar reconstructed images and MIPs were obtained and reviewed to evaluate the vascular anatomy. CONTRAST:  100mL OMNIPAQUE IOHEXOL 350 MG/ML SOLN COMPARISON:  CT abdomen pelvis 08/01/2012 FINDINGS: CTA CHEST FINDINGS Cardiovascular: Noncontrast CT of the chest demonstrates no evidence of aortic intramural hematoma. Preferential opacification of the thoracic aorta. No evidence of thoracic aortic dissection. Three vessel arch with widely patent branch vessels. Thoracic aortic measurements as follows: Sinuses of Valsalva: 4.0 cm Mid ascending thoracic aorta: 4.4 x 4.1 cm Proximal arch: 3.5 cm Distal arch: 2.8 cm Mid descending: 2.3 cm Pulmonary vasculature appears within normal limits. No central filling defect. Heart size is mildly enlarged. No pericardial effusion. Mediastinum/Nodes: No enlarged mediastinal, hilar, or axillary lymph nodes. Thyroid gland, trachea, and esophagus demonstrate no significant findings. Lungs/Pleura: Patchy dependent bibasilar opacities, favor atelectasis.  No pleural effusion. No pneumothorax. Musculoskeletal: No chest wall abnormality. No acute or significant osseous findings. Review of the MIP images confirms the above findings. CTA ABDOMEN AND PELVIS FINDINGS VASCULAR Aorta: Normal caliber aorta without aneurysm, dissection, vasculitis or significant stenosis. Scattered aortic atherosclerosis. Celiac: Patent without evidence of aneurysm, dissection, vasculitis or significant stenosis. SMA: Patent without evidence of aneurysm, dissection, vasculitis or significant stenosis. Renals: Paired bilateral renal arteries are patent without evidence of aneurysm, dissection, vasculitis, fibromuscular dysplasia or significant stenosis. IMA: Patent. Inflow: Patent without evidence of aneurysm, dissection, vasculitis or significant stenosis. Veins: No obvious venous abnormality within the limitations of this arterial phase study. Review of the MIP images confirms the above findings. NON-VASCULAR Hepatobiliary: No focal liver abnormality is seen. No gallstones, gallbladder wall thickening, or biliary dilatation. Pancreas: Unremarkable. No pancreatic ductal dilatation or surrounding inflammatory changes. Spleen: Normal in size without focal abnormality. Adrenals/Urinary Tract: Unremarkable adrenal glands. Kidneys enhance symmetrically without focal lesion, stone, or hydronephrosis. Ureters are nondilated. Faint layering hyperdensity within the bladder lumen favored to reflect early contrast excretion. Bladder is otherwise unremarkable. Stomach/Bowel: Stomach is within normal limits. Appendix appears normal. No evidence of bowel wall thickening, distention, or inflammatory changes. Lymphatic: No abdominopelvic lymphadenopathy. Reproductive: Uterus and bilateral adnexa are unremarkable. Other: No free fluid. No abdominopelvic fluid collection. No pneumoperitoneum. No abdominal wall hernia. Musculoskeletal: No acute or significant osseous findings. Review of the MIP images  confirms  the above findings. IMPRESSION: 1. Ascending thoracic aortic aneurysm measuring up to 4.4 cm. Recommend annual imaging followup by CTA or MRA. This recommendation follows 2010 ACCF/AHA/AATS/ACR/ASA/SCA/SCAI/SIR/STS/SVM Guidelines for the Diagnosis and Management of Patients with Thoracic Aortic Disease. Circulation. 2010; 121: B510-C585. Aortic aneurysm NOS (ICD10-I71.9) 2. Patchy dependent bibasilar opacities, favor atelectasis. Superimposed pneumonia would be difficult to exclude. 3. Mild cardiomegaly. 4. No acute findings abdomen or pelvis. Electronically Signed   By: Duanne Guess D.O.   On: 08/30/2020 15:36    CT head, CT chest,  echocardiogram   Subjective: Patient was seen and examined at bedside.  Overnight events noted.  Patient reports feeling much improved,  patient wants to be discharged home.  Discharge Exam: Vitals:   09/03/20 0428 09/03/20 0800  BP: (!) 140/92 (!) 150/73  Pulse: 68 (!) 58  Resp: 18 19  Temp: (!) 97.5 F (36.4 C) 98.8 F (37.1 C)  SpO2: 95% 98%   Vitals:   09/03/20 0027 09/03/20 0204 09/03/20 0428 09/03/20 0800  BP: 139/79  (!) 140/92 (!) 150/73  Pulse: (!) 55  68 (!) 58  Resp: 16  18 19   Temp: 98.3 F (36.8 C)  (!) 97.5 F (36.4 C) 98.8 F (37.1 C)  TempSrc: Oral  Oral Oral  SpO2: 96%  95% 98%  Weight:  100.3 kg    Height:        General: Pt is alert, awake, not in acute distress Cardiovascular: RRR, S1/S2 +, no rubs, no gallops Respiratory: CTA bilaterally, no wheezing, no rhonchi Abdominal: Soft, NT, ND, bowel sounds + Extremities: no edema, no cyanosis    The results of significant diagnostics from this hospitalization (including imaging, microbiology, ancillary and laboratory) are listed below for reference.     Microbiology: Recent Results (from the past 240 hour(s))  Resp Panel by RT-PCR (Flu A&B, Covid) Nasopharyngeal Swab     Status: None   Collection Time: 08/31/20  3:39 PM   Specimen: Nasopharyngeal Swab;  Nasopharyngeal(NP) swabs in vial transport medium  Result Value Ref Range Status   SARS Coronavirus 2 by RT PCR NEGATIVE NEGATIVE Final    Comment: (NOTE) SARS-CoV-2 target nucleic acids are NOT DETECTED.  The SARS-CoV-2 RNA is generally detectable in upper respiratory specimens during the acute phase of infection. The lowest concentration of SARS-CoV-2 viral copies this assay can detect is 138 copies/mL. A negative result does not preclude SARS-Cov-2 infection and should not be used as the sole basis for treatment or other patient management decisions. A negative result may occur with  improper specimen collection/handling, submission of specimen other than nasopharyngeal swab, presence of viral mutation(s) within the areas targeted by this assay, and inadequate number of viral copies(<138 copies/mL). A negative result must be combined with clinical observations, patient history, and epidemiological information. The expected result is Negative.  Fact Sheet for Patients:  09/02/20  Fact Sheet for Healthcare Providers:  BloggerCourse.com  This test is no t yet approved or cleared by the [employer] FDA and  has been authorized for detection and/or diagnosis of SARS-CoV-2 by FDA under an Emergency Use Authorization (EUA). This EUA will remain  in effect (meaning this test can be used) for the duration of the COVID-19 declaration under Section 564(b)(1) of the Act, 21 U.S.C.section 360bbb-3(b)(1), unless the authorization is terminated  or revoked sooner.       Influenza A by PCR NEGATIVE NEGATIVE Final   Influenza B by PCR NEGATIVE NEGATIVE Final    Comment: (NOTE)  The Xpert Xpress SARS-CoV-2/FLU/RSV plus assay is intended as an aid in the diagnosis of influenza from Nasopharyngeal swab specimens and should not be used as a sole basis for treatment. Nasal washings and aspirates are unacceptable for Xpert Xpress  SARS-CoV-2/FLU/RSV testing.  Fact Sheet for Patients: BloggerCourse.com  Fact Sheet for Healthcare Providers: [employer]ious[employer]ker[employer]it  This test is not yet approved or cleared by the [place] FDA and has been authorized for detection and/or diagnosis of SARS-CoV-2 by FDA under an Emergency Use Authorization (EUA). This EUA will remain in effect (meaning this test can be used) for the duration of the COVID-19 declaration under Section 564(b)(1) of the Act, 21 U.S.C. section 360bbb-3(b)(1), unless the authorization is terminated or revoked.  Performed at [facility], [address] [place], [place] [address]      Labs: BNP (last 3 results) No results for input(s): BNP in the last 8760 hours. Basic Metabolic Panel: Recent Labs  Lab 08/30/20 1311 08/31/20 1200 09/01/20 0259 09/02/20 0716 09/03/20 0324  NA 139 136 138 138 138  K 3.6 3.3* 3.1* 3.8 3.7  CL 105 101 101 102 103  CO2 GLUCOSE 111* 109* 102* 103* 105*  BUN 24* 21*  CREATININE 1.04* 1.08* 1.40* 1.30* 1.35*  CALCIUM 9.4 9.3 9.2 9.2 9.1  MG  --   --   --  2.0  --   PHOS  --   --   --  3.5  --    Liver Function Tests: Recent Labs  Lab 08/30/20 1311 08/31/20 1513 09/01/20 0259  AST ALT ALKPHOS 59 62 56  BILITOT 1.2 2.1* 1.5*  PROT 8.1 8.7* 7.0  ALBUMIN 3.9 4.1 3.5   Recent Labs  Lab 08/30/20 1311 08/31/20 1513  LIPASE 49 42   No results for input(s): AMMONIA in the last 168 hours. CBC: Recent Labs  Lab 08/30/20 1311 08/31/20 1200 09/01/20 0259  WBC 5.9 7.9 6.7  NEUTROABS  --   --  3.0  HGB 13.5 13.3 12.5  HCT 40.0 40.2 36.8  MCV 88.9 88.5 89.1  PLT 294 295 286   Cardiac Enzymes: No results for input(s): CKTOTAL, CKMB, CKMBINDEX, TROPONINI in the last 168 hours. BNP: Invalid input(s): POCBNP CBG: No results for input(s): GLUCAP in the last 168 hours. D-Dimer No results for input(s):  DDIMER in the last 72 hours. Hgb A1c No results for input(s): HGBA1C in the last 72 hours. Lipid Profile Recent Labs    09/01/20 0259  CHOL 254*  HDL 44  LDLCALC 191*  TRIG 95  CHOLHDL 5.8   Thyroid function studies No results for input(s): TSH, T4TOTAL, T3FREE, THYROIDAB in the last 72 hours.  Invalid input(s): FREET3 Anemia work up No results for input(s): VITAMINB12, FOLATE, FERRITIN, TIBC, IRON, RETICCTPCT in the last 72 hours. Urinalysis    Component Value Date/Time   COLORURINE RED (A) 08/04/2018 1722   APPEARANCEUR HAZY (A) 08/04/2018 1722   LABSPEC 1.006 08/04/2018 1722   PHURINE 6.0 08/04/2018 1722   GLUCOSEU NEGATIVE 08/04/2018 1722   HGBUR LARGE (A) 08/04/2018 1722   BILIRUBINUR NEGATIVE 08/04/2018 1722   KETONESUR NEGATIVE 08/04/2018 1722   PROTEINUR 30 (A) 08/04/2018 1722   UROBILINOGEN 0.2 12/11/2012 1831   NITRITE NEGATIVE 08/04/2018 1722   LEUKOCYTESUR MODERATE (A) 08/04/2018 1722   Sepsis Labs Invalid input(s): PROCALCITONIN,  WBC,  LACTICIDVEN Microbiology Recent Results (from the past 240 hour(s))  Resp Panel by RT-PCR (Flu A&B, Covid) Nasopharyngeal Swab     Status: None   Collection Time: 08/31/20  3:39 PM   Specimen: Nasopharyngeal Swab; Nasopharyngeal(NP) swabs in vial transport medium  Result Value Ref Range Status   SARS Coronavirus 2 by RT PCR NEGATIVE NEGATIVE Final    Comment: (NOTE) SARS-CoV-2 target nucleic acids are NOT DETECTED.  The SARS-CoV-2 RNA is generally detectable in upper respiratory specimens during the acute phase of infection. The lowest concentration of SARS-CoV-2 viral copies this assay can detect is 138 copies/mL. A negative result does not preclude SARS-Cov-2 infection and should not be used as the sole basis for treatment or other patient management decisions. A negative result may occur with  improper specimen collection/handling, submission of specimen other than nasopharyngeal swab, presence of viral mutation(s)  within the areas targeted by this assay, and inadequate number of viral copies(<138 copies/mL). A negative result must be combined with clinical observations, patient history, and epidemiological information. The expected result is Negative.  Fact Sheet for Patients:  BloggerCourse.com  Fact Sheet for Healthcare Providers:  SeriousBroker.it  This test is no t yet approved or cleared by the [place] FDA and  has been authorized for detection and/or diagnosis of SARS-CoV-2 by FDA under an Emergency Use Authorization (EUA). This EUA will remain  in effect (meaning this test can be used) for the duration of the COVID-19 declaration under Section 564(b)(1) of the Act, 21 U.S.C.section 360bbb-3(b)(1), unless the authorization is terminated  or revoked sooner.       Influenza A by PCR NEGATIVE NEGATIVE Final   Influenza B by PCR NEGATIVE NEGATIVE Final    Comment: (NOTE) The Xpert Xpress SARS-CoV-2/FLU/RSV plus assay is intended as an aid in the diagnosis of influenza from Nasopharyngeal swab specimens and should not be used as a sole basis for treatment. Nasal washings and aspirates are unacceptable for Xpert Xpress SARS-CoV-2/FLU/RSV testing.  Fact Sheet for Patients: BloggerCourse.com  Fact Sheet for Healthcare Providers: SeriousBroker.it  This test is not yet approved or cleared by the [place] FDA and has been authorized for detection and/or diagnosis of SARS-CoV-2 by FDA under an Emergency Use Authorization (EUA). This EUA will remain in effect (meaning this test can be used) for the duration of the COVID-19 declaration under Section 564(b)(1) of the Act, 21 U.S.C. section 360bbb-3(b)(1), unless the authorization is terminated or revoked.  Performed at [facility], [address] [place], [place] [address]      Time coordinating discharge: Over 30  minutes  SIGNED:   Cipriano Bunker, MD  [facility] 09/03/2020, 9:17 AM Pager   If 7PM-7AM, please contact night-coverage [email or web address]

## 2020-09-03 NOTE — Plan of Care (Signed)
  Problem: Activity: Goal: Risk for activity intolerance will decrease Outcome: Progressing   Problem: Nutrition: Goal: Adequate nutrition will be maintained Outcome: Progressing   Problem: Coping: Goal: Level of anxiety will decrease Outcome: Progressing   Problem: Elimination: Goal: Will not experience complications related to bowel motility Outcome: Progressing Goal: Will not experience complications related to urinary retention Outcome: Progressing   

## 2020-09-03 NOTE — Progress Notes (Signed)
D/C instructions given and reviewed. Notified NT pt was ready to transport OOF.

## 2020-09-03 NOTE — Plan of Care (Signed)

## 2020-09-03 NOTE — Discharge Instructions (Signed)
Advised to follow-up with primary care physician in 1 week.  Advised to follow-up with cardiology for CTA to rule out coronary artery disease.  Advised to take Lipitor 20 mg daily for hyperlipidemia.  Advised to take Coreg 6.25 mg twice daily and hydralazine 25 mg 3 times daily for blood pressure control.  We will request primary care physician to start ACE inhibitors or ARB is after renal functions improves.

## 2020-09-07 NOTE — ED Provider Notes (Signed)
[facility] EMERGENCY DEPARTMENT Provider Note   CSN: [id number] Arrival date & time: 08/30/20  1238     History Chief Complaint  Patient presents with  . Hypertension  . Chest Pain    Debbie Davidson is a 50 y.o. female who presents emergency department chief complaint of nausea and vomiting.  She has a past medical history of the same.  She ate [place] food last night and had vomiting all night long.  She is notably extremely hypertensive and has not been on any kind of medications for vomiting.  Patient complains of ripping, tearing chest pain this morning was clutching her chest and this is what brought her in.  Her mother died of thoracic aortic aneurysm.  She is concerned about this.  HPI     Past Medical History:  Diagnosis Date  . Hypertension   . Pain in the abdomen 08/31/2020    Patient Active Problem List   Diagnosis Date Noted  . Chest pain 08/31/2020  . Other and unspecified noninfectious gastroenteritis and colitis(558.9) 08/03/2012  . Hypokalemia 08/03/2012  . Abdominal pain, acute, epigastric 08/02/2012  . Nausea & vomiting 08/02/2012  . Hypertension     Past Surgical History:  Procedure Laterality Date  . TUBAL LIGATION    . WISDOM TOOTH EXTRACTION       OB History   No obstetric history on file.     Family History  Problem Relation Age of Onset  . Heart attack Mother        CABG  . Breast cancer Mother   . Heart attack Father   . Sudden Cardiac Death Father     Social History   Tobacco Use  . Smoking status: Never Smoker  . Smokeless tobacco: Never Used  Vaping Use  . Vaping Use: Never used  Substance Use Topics  . Alcohol use: No  . Drug use: No    Home Medications Prior to Admission medications   Medication Sig Start Date End Date Taking? Authorizing Provider  calcium carbonate (TUMS - DOSED IN MG ELEMENTAL CALCIUM) 500 MG chewable tablet Chew 2 tablets by mouth daily as needed for indigestion or heartburn.    Yes [provider]  cetirizine (ZYRTEC) 10 MG tablet Take 10 mg by mouth daily as needed for allergies.   Yes [provider]  Multiple Vitamin (MULTIVITAMIN) tablet Take 1 tablet by mouth daily.   Yes [provider]  omeprazole (PRILOSEC) 40 MG capsule Take 1 capsule (40 mg total) by mouth daily. 08/30/20 09/29/20 Yes Sharen Heck J, PA-C  ondansetron (ZOFRAN ODT) 4 MG disintegrating tablet Take 1 tablet (4 mg total) by mouth every 8 (eight) hours as needed for nausea or vomiting. 08/30/20  Yes Sharen Heck J, PA-C  sucralfate (CARAFATE) 1 GM/10ML suspension Take 10 mLs (1 g total) by mouth 4 (four) times daily -  with meals and at bedtime. 08/30/20  Yes Sharen Heck J, PA-C  atorvastatin (LIPITOR) 20 MG tablet Take 1 tablet (20 mg total) by mouth daily. 09/03/20   Cipriano Bunker, MD  carvedilol (COREG) 6.25 MG tablet Take 1 tablet (6.25 mg total) by mouth 2 (two) times daily with a meal. 09/03/20   Cipriano Bunker, MD  hydrALAZINE (APRESOLINE) 25 MG tablet Take 1 tablet (25 mg total) by mouth every 8 (eight) hours. 09/03/20   Cipriano Bunker, MD  HYDROcodone-acetaminophen (NORCO) 5-325 MG tablet Take 1 tablet by mouth every 6 (six) hours as needed. 09/03/20   Lucianne Muss,  Pardeep, MD    Allergies    Oxycodone and Penicillins  Review of Systems   Review of Systems Ten systems reviewed and are negative for acute change, except as noted in the HPI.   Physical Exam Updated Vital Signs BP (!) 163/89   Pulse 88   Temp 98.8 F (37.1 C) (Oral)   Resp 14   SpO2 95%   Physical Exam Vitals and nursing note reviewed.  Constitutional:      General: She is not in acute distress.    Appearance: She is well-developed. She is ill-appearing. She is not diaphoretic.     Comments: Praying to Jesus   HENT:     Head: Normocephalic and atraumatic.  Eyes:     General: No scleral icterus.    Conjunctiva/sclera: Conjunctivae normal.  Cardiovascular:     Rate and Rhythm:  Normal rate and regular rhythm.     Heart sounds: Normal heart sounds. No murmur heard. No friction rub. No gallop.   Pulmonary:     Effort: Pulmonary effort is normal. Tachypnea present. No respiratory distress.     Breath sounds: Normal breath sounds.  Abdominal:     General: Bowel sounds are normal. There is no distension.     Palpations: Abdomen is soft. There is no mass.     Tenderness: There is generalized abdominal tenderness. There is no guarding.  Musculoskeletal:     Cervical back: Normal range of motion.  Skin:    General: Skin is warm and dry.  Neurological:     Mental Status: She is alert and oriented to person, place, and time.  Psychiatric:        Behavior: Behavior normal.     ED Results / Procedures / Treatments   Labs (all labs ordered are listed, but only abnormal results are displayed) Labs Reviewed  COMPREHENSIVE METABOLIC PANEL - Abnormal; Notable for the following components:      Result Value   Glucose, Bld 111 (*)    Creatinine, Ser 1.04 (*)    All other components within normal limits  CBC  LIPASE, BLOOD  TROPONIN I (HIGH SENSITIVITY)  TROPONIN I (HIGH SENSITIVITY)    EKG EKG Interpretation  Date/Time:  Sunday August 30 2020 12:42:49 EDT Ventricular Rate:  66 PR Interval:    QRS Duration: 83 QT Interval:  413 QTC Calculation: 433 R Axis:   -11 Text Interpretation: Sinus rhythm Consider left ventricular hypertrophy Nonspecific T abnormalities, lateral leads Confirmed by Marily Memos [phone]) on 08/31/2020 10:12:20 AM   Radiology No results found.  Procedures Procedures   Medications Ordered in ED Medications  promethazine (PHENERGAN) 12.5 mg in sodium chloride 0.9 % 50 mL IVPB (0 mg Intravenous Stopped 08/30/20 1700)  HYDROmorphone (DILAUDID) injection 1 mg (1 mg Intravenous Given 08/30/20 1324)  sodium chloride 0.9 % bolus 1,000 mL (0 mLs Intravenous Stopped 08/30/20 1752)  iohexol (OMNIPAQUE) 350 MG/ML injection 100 mL (100 mLs  Intravenous Contrast Given 08/30/20 1500)  oxyCODONE-acetaminophen (PERCOCET/ROXICET) 5-325 MG per tablet 1 tablet (1 tablet Oral Given 08/30/20 1803)  hydrALAZINE (APRESOLINE) injection 5 mg (5 mg Intravenous Given 08/30/20 1807)  metoCLOPramide (REGLAN) injection 10 mg (10 mg Intravenous Given 08/30/20 1807)  diphenhydrAMINE (BENADRYL) injection 12.5 mg (12.5 mg Intravenous Given 08/30/20 1805)  alum & mag hydroxide-simeth (MAALOX/MYLANTA) 200-200-20 MG/5ML suspension 30 mL (30 mLs Oral Given 08/30/20 1804)    And  lidocaine (XYLOCAINE) 2 % viscous mouth solution 15 mL (15 mLs Oral Given 08/30/20 1804)  ED Course  I have reviewed the triage vital signs and the nursing notes.  Pertinent labs & imaging results that were available during my care of the patient were reviewed by me and considered in my medical decision making (see chart for details).   MDM Rules/Calculators/A&P                          Patient here with nausea and vomiting, severe hypertension.  She has a history of the same.  Question whether or not this may be cannabis hyperemesis gastritis, gastroparesis however patient is severely hypertensive and with her hearing pain must rule out dissection.  I have ordered labs, CT angio chest abdomen and pelvis, pain control initiated.  Signout given to PA Givens at shift handoff. Final Clinical Impression(s) / ED Diagnoses Final diagnoses:  Atypical chest pain  Epigastric abdominal pain  Elevated blood pressure reading    Rx / DC Orders ED Discharge Orders         Ordered    hydrochlorothiazide (HYDRODIURIL) 25 MG tablet  Daily,   Status:  Discontinued        08/30/20 2018    sucralfate (CARAFATE) 1 GM/10ML suspension  3 times daily with meals & bedtime        08/30/20 2018    omeprazole (PRILOSEC) 40 MG capsule  Daily        08/30/20 2018    ondansetron (ZOFRAN ODT) 4 MG disintegrating tablet  Every 8 hours PRN        08/30/20 2018           Arthor Captain,  PA-C 09/07/20 2002    Rolan Bucco, MD 09/09/20 1359

## 2020-09-09 ENCOUNTER — Inpatient Hospital Stay (HOSPITAL_COMMUNITY)
Admission: EM | Admit: 2020-09-09 | Discharge: 2020-09-12 | DRG: 305 | Disposition: A | Discharge status: Home / Self Care | Payer: BC Managed Care – PPO | Attending: Internal Medicine | Admitting: Internal Medicine

## 2020-09-09 ENCOUNTER — Other Ambulatory Visit: Payer: Self-pay

## 2020-09-09 ENCOUNTER — Observation Stay (HOSPITAL_COMMUNITY): Payer: BC Managed Care – PPO

## 2020-09-09 ENCOUNTER — Encounter (HOSPITAL_COMMUNITY): Payer: Self-pay

## 2020-09-09 DIAGNOSIS — R1013 Epigastric pain: Secondary | ICD-10-CM | POA: Diagnosis not present

## 2020-09-09 DIAGNOSIS — I714 Abdominal aortic aneurysm, without rupture: Secondary | ICD-10-CM | POA: Diagnosis present

## 2020-09-09 DIAGNOSIS — E785 Hyperlipidemia, unspecified: Secondary | ICD-10-CM | POA: Diagnosis present

## 2020-09-09 DIAGNOSIS — Z20822 Contact with and (suspected) exposure to covid-19: Secondary | ICD-10-CM | POA: Diagnosis present

## 2020-09-09 DIAGNOSIS — R079 Chest pain, unspecified: Secondary | ICD-10-CM | POA: Diagnosis present

## 2020-09-09 DIAGNOSIS — R519 Headache, unspecified: Secondary | ICD-10-CM | POA: Diagnosis present

## 2020-09-09 DIAGNOSIS — E669 Obesity, unspecified: Secondary | ICD-10-CM | POA: Diagnosis present

## 2020-09-09 DIAGNOSIS — N390 Urinary tract infection, site not specified: Secondary | ICD-10-CM | POA: Diagnosis present

## 2020-09-09 DIAGNOSIS — G44009 Cluster headache syndrome, unspecified, not intractable: Secondary | ICD-10-CM | POA: Diagnosis present

## 2020-09-09 DIAGNOSIS — I160 Hypertensive urgency: Principal | ICD-10-CM | POA: Diagnosis present

## 2020-09-09 DIAGNOSIS — R778 Other specified abnormalities of plasma proteins: Secondary | ICD-10-CM | POA: Diagnosis present

## 2020-09-09 DIAGNOSIS — Z79899 Other long term (current) drug therapy: Secondary | ICD-10-CM

## 2020-09-09 DIAGNOSIS — K219 Gastro-esophageal reflux disease without esophagitis: Secondary | ICD-10-CM | POA: Diagnosis present

## 2020-09-09 DIAGNOSIS — R112 Nausea with vomiting, unspecified: Secondary | ICD-10-CM | POA: Diagnosis present

## 2020-09-09 DIAGNOSIS — Z8249 Family history of ischemic heart disease and other diseases of the circulatory system: Secondary | ICD-10-CM

## 2020-09-09 DIAGNOSIS — Z880 Allergy status to penicillin: Secondary | ICD-10-CM

## 2020-09-09 DIAGNOSIS — I169 Hypertensive crisis, unspecified: Secondary | ICD-10-CM

## 2020-09-09 DIAGNOSIS — Z885 Allergy status to narcotic agent status: Secondary | ICD-10-CM

## 2020-09-09 DIAGNOSIS — Z6837 Body mass index (BMI) 37.0-37.9, adult: Secondary | ICD-10-CM

## 2020-09-09 DIAGNOSIS — R109 Unspecified abdominal pain: Secondary | ICD-10-CM

## 2020-09-09 LAB — CBC WITH DIFFERENTIAL/PLATELET
Abs Immature Granulocytes: 0.02 10*3/uL (ref 0.00–0.07)
Basophils Absolute: 0 10*3/uL (ref 0.0–0.1)
Basophils Relative: 1 %
Eosinophils Absolute: 0.1 10*3/uL (ref 0.0–0.5)
Eosinophils Relative: 2 %
HCT: 37.2 % (ref 36.0–46.0)
Hemoglobin: 12.3 g/dL (ref 12.0–15.0)
Immature Granulocytes: 0 %
Lymphocytes Relative: 38 %
Lymphs Abs: 2.3 10*3/uL (ref 0.7–4.0)
MCH: 29.9 pg (ref 26.0–34.0)
MCHC: 33.1 g/dL (ref 30.0–36.0)
MCV: 90.5 fL (ref 80.0–100.0)
Monocytes Absolute: 0.4 10*3/uL (ref 0.1–1.0)
Monocytes Relative: 6 %
Neutro Abs: 3.3 10*3/uL (ref 1.7–7.7)
Neutrophils Relative %: 53 %
Platelets: 297 10*3/uL (ref 150–400)
RBC: 4.11 MIL/uL (ref 3.87–5.11)
RDW: 13.9 % (ref 11.5–15.5)
WBC: 6.2 10*3/uL (ref 4.0–10.5)
nRBC: 0 % (ref 0.0–0.2)

## 2020-09-09 LAB — COMPREHENSIVE METABOLIC PANEL
ALT: 23 U/L (ref 0–44)
AST: 19 U/L (ref 15–41)
Albumin: 3.6 g/dL (ref 3.5–5.0)
Alkaline Phosphatase: 52 U/L (ref 38–126)
Anion gap: 7 (ref 5–15)
BUN: 15 mg/dL (ref 6–20)
CO2: 27 mmol/L (ref 22–32)
Calcium: 9.3 mg/dL (ref 8.9–10.3)
Chloride: 104 mmol/L (ref 98–111)
Creatinine, Ser: 1.06 mg/dL — ABNORMAL HIGH (ref 0.44–1.00)
GFR, Estimated: >60 mL/min (ref >60)
Glucose, Bld: 124 mg/dL — ABNORMAL HIGH (ref 70–99)
Potassium: 3.7 mmol/L (ref 3.5–5.1)
Sodium: 138 mmol/L (ref 135–145)
Total Bilirubin: 0.6 mg/dL (ref 0.3–1.2)
Total Protein: 7 g/dL (ref 6.5–8.1)

## 2020-09-09 LAB — RESP PANEL BY RT-PCR (FLU A&B, COVID) ARPGX2
Influenza A by PCR: NEGATIVE
Influenza B by PCR: NEGATIVE
SARS Coronavirus 2 by RT PCR: NEGATIVE

## 2020-09-09 LAB — TROPONIN I (HIGH SENSITIVITY)
Troponin I (High Sensitivity): 20 ng/L — ABNORMAL HIGH (ref <18)
Troponin I (High Sensitivity): 17 ng/L (ref <18)

## 2020-09-09 MED ORDER — LABETALOL HCL 5 MG/ML IV SOLN
20.0000 mg | Freq: Once | INTRAVENOUS | Status: AC
  Administered 2020-09-09: 20 mg via INTRAVENOUS
  Filled 2020-09-09: qty 4

## 2020-09-09 MED ORDER — SODIUM CHLORIDE 0.9% FLUSH
3.0000 mL | Freq: Two times a day (BID) | INTRAVENOUS | Status: DC
  Administered 2020-09-09 – 2020-09-12 (×5): 3 mL via INTRAVENOUS

## 2020-09-09 MED ORDER — CARVEDILOL 6.25 MG PO TABS
6.2500 mg | ORAL_TABLET | Freq: Two times a day (BID) | ORAL | Status: DC
  Administered 2020-09-09 – 2020-09-11 (×3): 6.25 mg via ORAL
  Filled 2020-09-09 (×3): qty 1

## 2020-09-09 MED ORDER — ATORVASTATIN CALCIUM 10 MG PO TABS
20.0000 mg | ORAL_TABLET | Freq: Every day | ORAL | Status: DC
  Administered 2020-09-09 – 2020-09-12 (×4): 20 mg via ORAL
  Filled 2020-09-09 (×4): qty 2

## 2020-09-09 MED ORDER — LORATADINE 10 MG PO TABS
10.0000 mg | ORAL_TABLET | Freq: Every day | ORAL | Status: DC
  Administered 2020-09-09 – 2020-09-12 (×4): 10 mg via ORAL
  Filled 2020-09-09 (×4): qty 1

## 2020-09-09 MED ORDER — ONDANSETRON HCL 4 MG/2ML IJ SOLN
4.0000 mg | Freq: Once | INTRAMUSCULAR | Status: AC
  Administered 2020-09-09: 4 mg via INTRAVENOUS
  Filled 2020-09-09: qty 2

## 2020-09-09 MED ORDER — MORPHINE SULFATE (PF) 2 MG/ML IV SOLN
2.0000 mg | INTRAVENOUS | Status: AC | PRN
Start: 2020-09-09 — End: 2020-09-10
  Administered 2020-09-09 – 2020-09-10 (×4): 2 mg via INTRAVENOUS
  Filled 2020-09-09 (×4): qty 1

## 2020-09-09 MED ORDER — PANTOPRAZOLE SODIUM 40 MG IV SOLR
40.0000 mg | Freq: Two times a day (BID) | INTRAVENOUS | Status: DC
  Administered 2020-09-09 – 2020-09-11 (×5): 40 mg via INTRAVENOUS
  Filled 2020-09-09 (×5): qty 40

## 2020-09-09 MED ORDER — SUCRALFATE 1 GM/10ML PO SUSP
1.0000 g | Freq: Three times a day (TID) | ORAL | Status: DC
  Administered 2020-09-09 – 2020-09-12 (×10): 1 g via ORAL
  Filled 2020-09-09 (×14): qty 10

## 2020-09-09 MED ORDER — ONDANSETRON HCL 4 MG/2ML IJ SOLN
4.0000 mg | Freq: Four times a day (QID) | INTRAMUSCULAR | Status: DC | PRN
  Administered 2020-09-09 – 2020-09-12 (×7): 4 mg via INTRAVENOUS
  Filled 2020-09-09 (×7): qty 2

## 2020-09-09 MED ORDER — LORAZEPAM 1 MG PO TABS
0.5000 mg | ORAL_TABLET | Freq: Once | ORAL | Status: AC
  Administered 2020-09-09: 0.5 mg via ORAL
  Filled 2020-09-09: qty 1

## 2020-09-09 MED ORDER — HYDRALAZINE HCL 25 MG PO TABS: 25.0000 mg | ORAL_TABLET | Freq: Three times a day (TID) | ORAL | Status: DC

## 2020-09-09 MED ORDER — ACETAMINOPHEN 650 MG RE SUPP: 650.0000 mg | Freq: Four times a day (QID) | RECTAL | Status: DC | PRN

## 2020-09-09 MED ORDER — ACETAMINOPHEN 325 MG PO TABS
650.0000 mg | ORAL_TABLET | Freq: Four times a day (QID) | ORAL | Status: DC | PRN
  Filled 2020-09-09: qty 2

## 2020-09-09 MED ORDER — ALBUTEROL SULFATE (2.5 MG/3ML) 0.083% IN NEBU: 2.5000 mg | INHALATION_SOLUTION | Freq: Four times a day (QID) | RESPIRATORY_TRACT | Status: DC | PRN

## 2020-09-09 MED ORDER — MORPHINE SULFATE (PF) 4 MG/ML IV SOLN
4.0000 mg | Freq: Once | INTRAVENOUS | Status: AC
  Administered 2020-09-09: 4 mg via INTRAVENOUS
  Filled 2020-09-09: qty 1

## 2020-09-09 MED ORDER — HYDRALAZINE HCL 25 MG PO TABS: 50.0000 mg | ORAL_TABLET | Freq: Three times a day (TID) | ORAL | Status: DC

## 2020-09-09 MED ORDER — ENOXAPARIN SODIUM 60 MG/0.6ML ~~LOC~~ SOLN
50.0000 mg | SUBCUTANEOUS | Status: DC
  Administered 2020-09-09 – 2020-09-11 (×3): 50 mg via SUBCUTANEOUS
  Filled 2020-09-09: qty 0.5
  Filled 2020-09-09 (×2): qty 0.6

## 2020-09-09 MED ORDER — HYDRALAZINE HCL 20 MG/ML IJ SOLN
10.0000 mg | INTRAMUSCULAR | Status: DC | PRN
  Administered 2020-09-09: 10 mg via INTRAVENOUS
  Filled 2020-09-09: qty 1

## 2020-09-09 MED ORDER — CARVEDILOL 12.5 MG PO TABS: 6.2500 mg | ORAL_TABLET | Freq: Two times a day (BID) | ORAL | Status: DC

## 2020-09-09 MED ORDER — HYDRALAZINE HCL 50 MG PO TABS
50.0000 mg | ORAL_TABLET | Freq: Three times a day (TID) | ORAL | Status: DC
  Administered 2020-09-09 – 2020-09-10 (×3): 50 mg via ORAL
  Filled 2020-09-09 (×2): qty 1
  Filled 2020-09-09: qty 2

## 2020-09-09 NOTE — ED Triage Notes (Signed)
Came back for same issues - chest pain, headache radiating down to her neck and elevated blood pressure. Pt reports even though she has been complaint with her meds, she still feels unwell.

## 2020-09-09 NOTE — ED Provider Notes (Signed)
MSE was initiated and I personally evaluated the patient and placed orders (if any) at  3:53 AM on September 09, 2020.  Patient returns to ED with symptoms of chest pain, dizziness, throbbing headache - the same as recently seen in the ED x 2, admission x 1 for uncontrolled hypertension. She states she is compliant with the medications prescribed. Reports systolic BP at home 220.   Today's Vitals   09/09/20 0350 09/09/20 0351  Weight:  100.3 kg  Height:  5\' 4"  (1.626 m)  PainSc: 5     Body mass index is 37.96 kg/m.  Cardiac: RRR Resp - clear bilaterally.   The patient appears stable so that the remainder of the MSE may be completed by another provider.   , PA-C 09/09/20 11/09/20    [id number], MD 09/09/20 [phone]

## 2020-09-09 NOTE — ED Notes (Signed)
Dr. Judd Lien notified on patient's hypertension .

## 2020-09-09 NOTE — ED Provider Notes (Signed)
[facility] EMERGENCY DEPARTMENT Provider Note   CSN: [id number] Arrival date & time: 09/09/20  0342     History Chief Complaint  Patient presents with  . Chest Pain  . Headache    Debbie Davidson is a 50 y.o. female.  Patient is a 50 year old female with past medical history of hypertension with recent admission for hypertensive crisis.  Patient presents today for evaluation of headache, feeling poorly, pain in her epigastrium, and elevated blood pressure.  This came on acutely this evening at about midnight.  She denies she is having any chest pain or difficulty breathing.  She denies any weakness or numbness.  She reports being compliant with the medications that were started during her prior hospitalization.  The history is provided by the patient.       Past Medical History:  Diagnosis Date  . Hypertension   . Pain in the abdomen 08/31/2020    Patient Active Problem List   Diagnosis Date Noted  . Chest pain 08/31/2020  . Other and unspecified noninfectious gastroenteritis and colitis(558.9) 08/03/2012  . Hypokalemia 08/03/2012  . Abdominal pain, acute, epigastric 08/02/2012  . Nausea & vomiting 08/02/2012  . Hypertension     Past Surgical History:  Procedure Laterality Date  . TUBAL LIGATION    . WISDOM TOOTH EXTRACTION       OB History   No obstetric history on file.     Family History  Problem Relation Age of Onset  . Heart attack Mother        CABG  . Breast cancer Mother   . Heart attack Father   . Sudden Cardiac Death Father     Social History   Tobacco Use  . Smoking status: Never Smoker  . Smokeless tobacco: Never Used  Vaping Use  . Vaping Use: Never used  Substance Use Topics  . Alcohol use: No  . Drug use: No    Home Medications Prior to Admission medications   Medication Sig Start Date End Date Taking? Authorizing Provider  atorvastatin (LIPITOR) 20 MG tablet Take 1 tablet (20 mg total) by mouth daily. 09/03/20    Cipriano Bunker, MD  calcium carbonate (TUMS - DOSED IN MG ELEMENTAL CALCIUM) 500 MG chewable tablet Chew 2 tablets by mouth daily as needed for indigestion or heartburn.    [provider]  carvedilol (COREG) 6.25 MG tablet Take 1 tablet (6.25 mg total) by mouth 2 (two) times daily with a meal. 09/03/20   Cipriano Bunker, MD  cetirizine (ZYRTEC) 10 MG tablet Take 10 mg by mouth daily as needed for allergies.    [provider]  hydrALAZINE (APRESOLINE) 25 MG tablet Take 1 tablet (25 mg total) by mouth every 8 (eight) hours. 09/03/20   Cipriano Bunker, MD  HYDROcodone-acetaminophen (NORCO) 5-325 MG tablet Take 1 tablet by mouth every 6 (six) hours as needed. 09/03/20   Cipriano Bunker, MD  Multiple Vitamin (MULTIVITAMIN) tablet Take 1 tablet by mouth daily.    [provider]  omeprazole (PRILOSEC) 40 MG capsule Take 1 capsule (40 mg total) by mouth daily. 08/30/20 09/29/20  Liberty Handy, PA-C  ondansetron (ZOFRAN ODT) 4 MG disintegrating tablet Take 1 tablet (4 mg total) by mouth every 8 (eight) hours as needed for nausea or vomiting. 08/30/20   Liberty Handy, PA-C  sucralfate (CARAFATE) 1 GM/10ML suspension Take 10 mLs (1 g total) by mouth 4 (four) times daily -  with meals and at bedtime. 08/30/20  Liberty Handy, PA-C    Allergies    Oxycodone and Penicillins  Review of Systems   Review of Systems  All other systems reviewed and are negative.   Physical Exam Updated Vital Signs BP (!) 195/111   Pulse (!) 53   Temp 98.6 F (37 C) (Oral)   Resp 15   Ht 5\' 4"  (1.626 m)   Wt 100.3 kg   SpO2 97%   BMI 37.96 kg/m   Physical Exam Vitals and nursing note reviewed.  Constitutional:      General: She is not in acute distress.    Appearance: She is well-developed. She is not diaphoretic.  HENT:     Head: Normocephalic and atraumatic.  Cardiovascular:     Rate and Rhythm: Normal rate and regular rhythm.     Heart sounds: No murmur heard. No  friction rub. No gallop.   Pulmonary:     Effort: Pulmonary effort is normal. No respiratory distress.     Breath sounds: Normal breath sounds. No wheezing.  Abdominal:     General: Bowel sounds are normal. There is no distension.     Palpations: Abdomen is soft.     Tenderness: There is no abdominal tenderness.  Musculoskeletal:        General: Normal range of motion.     Cervical back: Normal range of motion and neck supple.     Right lower leg: No tenderness. No edema.     Left lower leg: No tenderness. No edema.  Skin:    General: Skin is warm and dry.  Neurological:     General: No focal deficit present.     Mental Status: She is alert and oriented to person, place, and time.     Cranial Nerves: No cranial nerve deficit.     Motor: No weakness.     ED Results / Procedures / Treatments   Labs (all labs ordered are listed, but only abnormal results are displayed) Labs Reviewed  CBC WITH DIFFERENTIAL/PLATELET  COMPREHENSIVE METABOLIC PANEL  TROPONIN I (HIGH SENSITIVITY)    EKG ED ECG REPORT   Date: 09/09/2020  Rate: 50  Rhythm: sinus bradycardia  QRS Axis: left  Intervals: normal  ST/T Wave abnormalities: nonspecific T wave changes  Conduction Disutrbances:none  Narrative Interpretation:   Old EKG Reviewed: unchanged  I have personally reviewed the EKG tracing and agree with the computerized printout as noted.   Radiology No results found.  Procedures Procedures   Medications Ordered in ED Medications  labetalol (NORMODYNE) injection 20 mg (has no administration in time range)  ondansetron (ZOFRAN) injection 4 mg (has no administration in time range)    ED Course  I have reviewed the triage vital signs and the nursing notes.  Pertinent labs & imaging results that were available during my care of the patient were reviewed by me and considered in my medical decision making (see chart for details).    MDM Rules/Calculators/A&P  Patient with recent  admission for hypertensive crisis presenting with complaints of elevated blood pressure, headache, and chest pain.  This started acutely this evening in the absence of any injury or trauma.  Patient's blood pressure reported to be 240 systolic at home and continues to be elevated here in the ER.  Her initial EKG shows no acute changes.  She is neurologically intact with no focal deficits, although is complaining of headache and feeling generally unwell.  Vitals are otherwise stable.  Work-up shows mild elevation of troponin at  20 and renal function consistent with prior studies.  Patient given labetalol, along with morphine for her headache/chest pain, and Zofran for nausea.  Her blood pressures have improved somewhat, however she continues with chest discomfort and headache.  Patient to be admitted for further management of her blood pressure.  Dr. Julian Reil agrees to admit.  CRITICAL CARE Performed by: Geoffery Lyons Total critical care time: 35 minutes Critical care time was exclusive of separately billable procedures and treating other patients. Critical care was necessary to treat or prevent imminent or life-threatening deterioration. Critical care was time spent personally by me on the following activities: development of treatment plan with patient and/or surrogate as well as nursing, discussions with consultants, evaluation of patient's response to treatment, examination of patient, obtaining history from patient or surrogate, ordering and performing treatments and interventions, ordering and review of laboratory studies, ordering and review of radiographic studies, pulse oximetry and re-evaluation of patient's condition.   Final Clinical Impression(s) / ED Diagnoses Final diagnoses:  None    Rx / DC Orders ED Discharge Orders    None       Geoffery Lyons, MD 09/09/20 [phone]

## 2020-09-09 NOTE — H&P (Signed)
History and Physical    HIKARI TRIPP ZOX:[id number] DOB: [birth year] DOA: 09/09/2020  Referring MD/NP/PA: Lyda Perone, MD PCP: Burtis Junes, MD (Inactive)  Patient coming from: home   Chief Complaint: Chest pain and headache  I have personally briefly reviewed patient's old medical records in [facility]   HPI: Debbie Davidson is a 50 y.o. female with medical history significant of hypertension, AAA, and obesity presents with complaints of chest pain and headache.  Patient just recently been hospitalized from 3/28-3/31 for atypical chest pain and abdominal pain.  Patient underwent CT of the chest with no evidence of coronary calcification and echocardiogram revealed EF of 50 to 55% with no regional wall motion abnormalities.  Cardiology formally evaluated patient and recommended outpatient CT coronary study was recommended as an outpatient.  After getting home patient reports that she felt no better.  She reports taking medications as prescribed, but noted that systolic blood pressures range from 130-170s.  Yesterday evening however her systolic blood pressures were elevated up to 222.  Noted to have left substernal chest sharp pain and tightness.  Associated symptoms include having headache with blurry vision, malaise, and epigastric abdominal pain.  Denies any focal weakness to her knowledge.  Since being here in the emergency department patient reports that she has had at least 3 episodes of nausea and vomiting.  ED Course: Upon admission into the emergency department patient was seen to be afebrile, pulse 53-76, respirations 12-25, blood pressures elevated up to 195/111, and O2 saturations maintained on room air.  Labs significant for CBC within normal limits, hemoglobin 12.3, BUN 15, creatinine 1.06, and high-sensitivity troponin 20->17.  Influenza and COVID-19 screening were negative.  Patient was given labetalol 20 mg IV, 4 mg of morphine, Zofran.  TRH called to  admit.  Review of Systems  Constitutional: Positive for malaise/fatigue. Negative for fever.  HENT: Negative for congestion and nosebleeds.   Eyes: Positive for blurred vision. Negative for pain.  Respiratory: Positive for shortness of breath. Negative for cough.   Cardiovascular: Positive for chest pain. Negative for leg swelling.  Gastrointestinal: Positive for abdominal pain, nausea and vomiting.  Genitourinary: Negative for dysuria and hematuria.  Musculoskeletal: Negative for falls.  Skin: Negative for rash.  Neurological: Positive for headaches. Negative for loss of consciousness.  Psychiatric/Behavioral: Negative for substance abuse. The patient has insomnia.     Past Medical History:  Diagnosis Date  . Hypertension   . Pain in the abdomen 08/31/2020    Past Surgical History:  Procedure Laterality Date  . TUBAL LIGATION    . WISDOM TOOTH EXTRACTION       reports that she has never smoked. She has never used smokeless tobacco. She reports that she does not drink alcohol and does not use drugs.  Allergies  Allergen Reactions  . Oxycodone Nausea And Vomiting  . Penicillins Nausea And Vomiting    Did it involve swelling of the face/tongue/throat, SOB, or low BP? N Did it involve sudden or severe rash/hives, skin peeling, or any reaction on the inside of your mouth or nose? N Did you need to seek medical attention at a hospital or doctor's office? N When did it last happen?Several Years Ago If all above answers are "NO", may proceed with cephalosporin use.      Family History  Problem Relation Age of Onset  . Heart attack Mother        CABG  . Breast cancer Mother   . Heart attack  Father   . Sudden Cardiac Death Father     Prior to Admission medications   Medication Sig Start Date End Date Taking? Authorizing Provider  atorvastatin (LIPITOR) 20 MG tablet Take 1 tablet (20 mg total) by mouth daily. 09/03/20   Cipriano Bunker, MD  calcium carbonate (TUMS -  DOSED IN MG ELEMENTAL CALCIUM) 500 MG chewable tablet Chew 2 tablets by mouth daily as needed for indigestion or heartburn.    [provider]  carvedilol (COREG) 6.25 MG tablet Take 1 tablet (6.25 mg total) by mouth 2 (two) times daily with a meal. 09/03/20   Cipriano Bunker, MD  cetirizine (ZYRTEC) 10 MG tablet Take 10 mg by mouth daily as needed for allergies.    [provider]  hydrALAZINE (APRESOLINE) 25 MG tablet Take 1 tablet (25 mg total) by mouth every 8 (eight) hours. 09/03/20   Cipriano Bunker, MD  HYDROcodone-acetaminophen (NORCO) 5-325 MG tablet Take 1 tablet by mouth every 6 (six) hours as needed. 09/03/20   Cipriano Bunker, MD  Multiple Vitamin (MULTIVITAMIN) tablet Take 1 tablet by mouth daily.    [provider]  omeprazole (PRILOSEC) 40 MG capsule Take 1 capsule (40 mg total) by mouth daily. 08/30/20 09/29/20  Liberty Handy, PA-C  ondansetron (ZOFRAN ODT) 4 MG disintegrating tablet Take 1 tablet (4 mg total) by mouth every 8 (eight) hours as needed for nausea or vomiting. 08/30/20   Liberty Handy, PA-C  sucralfate (CARAFATE) 1 GM/10ML suspension Take 10 mLs (1 g total) by mouth 4 (four) times daily -  with meals and at bedtime. 08/30/20   Liberty Handy, PA-C    Physical Exam:  Constitutional: Middle-aged female who appears acutely ill Vitals:   09/09/20 0630 09/09/20 0715 09/09/20 0739 09/09/20 0745  BP: (!) 167/94 (!) 152/101  (!) 160/79  Pulse: (!) 54 (!) 50  76  Resp: 12 12  18   Temp:   98.2 F (36.8 C)   TempSrc:   Oral   SpO2: 96% 98%  97%  Weight:      Height:       Eyes: PERRL, lids and conjunctivae normal ENMT: Mucous membranes are moist. Posterior pharynx clear of any exudate or lesions.  Neck: normal, supple, no masses, no thyromegaly Respiratory: clear to auscultation bilaterally, no wheezing, no crackles. Normal respiratory effort. No accessory muscle use.  Cardiovascular: Regular rate and rhythm, no murmurs / rubs /  gallops. No extremity edema. 2+ pedal pulses. No carotid bruits.  Abdomen: no tenderness, no masses palpated. No hepatosplenomegaly. Bowel sounds positive.  Musculoskeletal: no clubbing / cyanosis. No joint deformity upper and lower extremities. Good ROM, no contractures. Normal muscle tone.  Skin: no rashes, lesions, ulcers. No induration Neurologic: CN 2-12 grossly intact. Sensation intact, DTR normal. Strength 5/5 in all 4.  Psychiatric: Normal judgment and insight. Alert and oriented x 3. Normal mood.     Labs on Admission: I have personally reviewed following labs and imaging studies  CBC: Recent Labs  Lab 09/09/20 0359  WBC 6.2  NEUTROABS 3.3  HGB 12.3  HCT 37.2  MCV 90.5  PLT 297   Basic Metabolic Panel: Recent Labs  Lab 09/03/20 0324 09/09/20 0359  NA 138 138  K 3.7 3.7  CL 103 104  CO2 29 27  GLUCOSE 105* 124*  BUN 21* 15  CREATININE 1.35* 1.06*  CALCIUM 9.1 9.3   GFR: Estimated Creatinine Clearance: 73.1 mL/min (A) (by C-G formula based on SCr of 1.06 mg/dL (  H)). Liver Function Tests: Recent Labs  Lab 09/09/20 0359  AST 19  ALT 23  ALKPHOS 52  BILITOT 0.6  PROT 7.0  ALBUMIN 3.6   No results for input(s): LIPASE, AMYLASE in the last 168 hours. No results for input(s): AMMONIA in the last 168 hours. Coagulation Profile: No results for input(s): INR, PROTIME in the last 168 hours. Cardiac Enzymes: No results for input(s): CKTOTAL, CKMB, CKMBINDEX, TROPONINI in the last 168 hours. BNP (last 3 results) No results for input(s): PROBNP in the last 8760 hours. HbA1C: No results for input(s): HGBA1C in the last 72 hours. CBG: No results for input(s): GLUCAP in the last 168 hours. Lipid Profile: No results for input(s): CHOL, HDL, LDLCALC, TRIG, CHOLHDL, LDLDIRECT in the last 72 hours. Thyroid Function Tests: No results for input(s): TSH, T4TOTAL, FREET4, T3FREE, THYROIDAB in the last 72 hours. Anemia Panel: No results for input(s): VITAMINB12,  FOLATE, FERRITIN, TIBC, IRON, RETICCTPCT in the last 72 hours. Urine analysis:    Component Value Date/Time   COLORURINE RED (A) 08/04/2018 1722   APPEARANCEUR HAZY (A) 08/04/2018 1722   LABSPEC 1.006 08/04/2018 1722   PHURINE 6.0 08/04/2018 1722   GLUCOSEU NEGATIVE 08/04/2018 1722   HGBUR LARGE (A) 08/04/2018 1722   BILIRUBINUR NEGATIVE 08/04/2018 1722   KETONESUR NEGATIVE 08/04/2018 1722   PROTEINUR 30 (A) 08/04/2018 1722   UROBILINOGEN 0.2 12/11/2012 1831   NITRITE NEGATIVE 08/04/2018 1722   LEUKOCYTESUR MODERATE (A) 08/04/2018 1722   Sepsis Labs: Recent Results (from the past 240 hour(s))  Resp Panel by RT-PCR (Flu A&B, Covid) Nasopharyngeal Swab     Status: None   Collection Time: 08/31/20  3:39 PM   Specimen: Nasopharyngeal Swab; Nasopharyngeal(NP) swabs in vial transport medium  Result Value Ref Range Status   SARS Coronavirus 2 by RT PCR NEGATIVE NEGATIVE Final    Comment: (NOTE) SARS-CoV-2 target nucleic acids are NOT DETECTED.  The SARS-CoV-2 RNA is generally detectable in upper respiratory specimens during the acute phase of infection. The lowest concentration of SARS-CoV-2 viral copies this assay can detect is 138 copies/mL. A negative result does not preclude SARS-Cov-2 infection and should not be used as the sole basis for treatment or other patient management decisions. A negative result may occur with  improper specimen collection/handling, submission of specimen other than nasopharyngeal swab, presence of viral mutation(s) within the areas targeted by this assay, and inadequate number of viral copies(<138 copies/mL). A negative result must be combined with clinical observations, patient history, and epidemiological information. The expected result is Negative.  Fact Sheet for Patients:  BloggerCourse.comhttps://www.fda.gov/media/152166/download  Fact Sheet for Healthcare Providers:  SeriousBroker.ithttps://www.fda.gov/media/152162/download  This test is no t yet approved or cleared by  the [place] FDA and  has been authorized for detection and/or diagnosis of SARS-CoV-2 by FDA under an Emergency Use Authorization (EUA). This EUA will remain  in effect (meaning this test can be used) for the duration of the COVID-19 declaration under Section 564(b)(1) of the Act, 21 U.S.C.section 360bbb-3(b)(1), unless the authorization is terminated  or revoked sooner.       Influenza A by PCR NEGATIVE NEGATIVE Final   Influenza B by PCR NEGATIVE NEGATIVE Final    Comment: (NOTE) The Xpert Xpress SARS-CoV-2/FLU/RSV plus assay is intended as an aid in the diagnosis of influenza from Nasopharyngeal swab specimens and should not be used as a sole basis for treatment. Nasal washings and aspirates are unacceptable for Xpert Xpress SARS-CoV-2/FLU/RSV testing.  Fact Sheet for Patients: BloggerCourse.comhttps://www.fda.gov/media/152166/download  Fact Sheet for Healthcare Providers: SeriousBroker.it  This test is not yet approved or cleared by the [place] FDA and has been authorized for detection and/or diagnosis of SARS-CoV-2 by FDA under an Emergency Use Authorization (EUA). This EUA will remain in effect (meaning this test can be used) for the duration of the COVID-19 declaration under Section 564(b)(1) of the Act, 21 U.S.C. section 360bbb-3(b)(1), unless the authorization is terminated or revoked.  Performed at [facility], [address] [place], [place] [address]   Resp Panel by RT-PCR (Flu A&B, Covid) Nasopharyngeal Swab     Status: None   Collection Time: 09/09/20  6:00 AM   Specimen: Nasopharyngeal Swab; Nasopharyngeal(NP) swabs in vial transport medium  Result Value Ref Range Status   SARS Coronavirus 2 by RT PCR NEGATIVE NEGATIVE Final    Comment: (NOTE) SARS-CoV-2 target nucleic acids are NOT DETECTED.  The SARS-CoV-2 RNA is generally detectable in upper respiratory specimens during the acute phase of infection. The lowest concentration  of SARS-CoV-2 viral copies this assay can detect is 138 copies/mL. A negative result does not preclude SARS-Cov-2 infection and should not be used as the sole basis for treatment or other patient management decisions. A negative result may occur with  improper specimen collection/handling, submission of specimen other than nasopharyngeal swab, presence of viral mutation(s) within the areas targeted by this assay, and inadequate number of viral copies(<138 copies/mL). A negative result must be combined with clinical observations, patient history, and epidemiological information. The expected result is Negative.  Fact Sheet for Patients:  BloggerCourse.com  Fact Sheet for Healthcare Providers:  SeriousBroker.it  This test is no t yet approved or cleared by the [place] FDA and  has been authorized for detection and/or diagnosis of SARS-CoV-2 by FDA under an Emergency Use Authorization (EUA). This EUA will remain  in effect (meaning this test can be used) for the duration of the COVID-19 declaration under Section 564(b)(1) of the Act, 21 U.S.C.section 360bbb-3(b)(1), unless the authorization is terminated  or revoked sooner.       Influenza A by PCR NEGATIVE NEGATIVE Final   Influenza B by PCR NEGATIVE NEGATIVE Final    Comment: (NOTE) The Xpert Xpress SARS-CoV-2/FLU/RSV plus assay is intended as an aid in the diagnosis of influenza from Nasopharyngeal swab specimens and should not be used as a sole basis for treatment. Nasal washings and aspirates are unacceptable for Xpert Xpress SARS-CoV-2/FLU/RSV testing.  Fact Sheet for Patients: BloggerCourse.com  Fact Sheet for Healthcare Providers: SeriousBroker.it  This test is not yet approved or cleared by the [place] FDA and has been authorized for detection and/or diagnosis of SARS-CoV-2 by FDA under an Emergency Use  Authorization (EUA). This EUA will remain in effect (meaning this test can be used) for the duration of the COVID-19 declaration under Section 564(b)(1) of the Act, 21 U.S.C. section 360bbb-3(b)(1), unless the authorization is terminated or revoked.  Performed at [facility], [address] [place], [place] [address]      Radiological Exams on Admission: No results found.  EKG: Independently reviewed. Sinus bradycardia 50 bpm  Assessment/Plan Hypertensive urgency: Acute.  Patient presented with complaints of blood pressures elevated up to 222 at home.  Patient reports that she has been compliant with medications, but on average blood pressures have ranged from 130-170 throughout the day.  Home blood pressure medications include Coreg 6.25 mg twice daily and hydralazine 25 mg every 8 hours. -Admit to a medical telemetry bed -  Increase hydralazine to 50 mg 3 times daily -Continue Coreg at current dose -Hydralazine IV as needed for elevated blood pressures  Headache: Suspect secondary to above. -Tylenol as needed  Nausea, vomiting, and abdominal pain: Acute on chronic.  Patient's abdominal pain was thought secondary to gastroenteritis during last hospitalization. -Check urinalysis -Antiemetics as needed  Chest pain elevated troponin: Patient reported having substernal chest pain similar to last admission when blood pressures elevated.  High-sensitivity troponins 20->17.  EKG without significant ischemic changes.  Patient has been recommended to follow-up with cardiology in the outpatient setting for a coronary CT. -Continue outpatient follow-up with cardiology  Hyperlipidemia -Continue atorvastatin 20 mg daily  GERD  -Changed PPI to Protonix IV  -Continue Carafate once able  DVT prophylaxis: Lovenox Code Status: Full Family Communication: Son updated at bedside Disposition Plan: Hopefully home once blood pressure under better control Consults called: None Admission  status: Observation  Clydie Braun MD [facility]   If 7PM-7AM, please contact night-coverage   09/09/2020, 10:22 AM

## 2020-09-10 ENCOUNTER — Inpatient Hospital Stay (HOSPITAL_COMMUNITY): Payer: BC Managed Care – PPO

## 2020-09-10 DIAGNOSIS — R112 Nausea with vomiting, unspecified: Secondary | ICD-10-CM

## 2020-09-10 DIAGNOSIS — R109 Unspecified abdominal pain: Secondary | ICD-10-CM | POA: Diagnosis not present

## 2020-09-10 DIAGNOSIS — Z880 Allergy status to penicillin: Secondary | ICD-10-CM | POA: Diagnosis not present

## 2020-09-10 DIAGNOSIS — G44019 Episodic cluster headache, not intractable: Secondary | ICD-10-CM | POA: Diagnosis not present

## 2020-09-10 DIAGNOSIS — N390 Urinary tract infection, site not specified: Secondary | ICD-10-CM | POA: Diagnosis present

## 2020-09-10 DIAGNOSIS — K219 Gastro-esophageal reflux disease without esophagitis: Secondary | ICD-10-CM | POA: Diagnosis present

## 2020-09-10 DIAGNOSIS — Z8249 Family history of ischemic heart disease and other diseases of the circulatory system: Secondary | ICD-10-CM | POA: Diagnosis not present

## 2020-09-10 DIAGNOSIS — Z20822 Contact with and (suspected) exposure to covid-19: Secondary | ICD-10-CM | POA: Diagnosis present

## 2020-09-10 DIAGNOSIS — Z885 Allergy status to narcotic agent status: Secondary | ICD-10-CM | POA: Diagnosis not present

## 2020-09-10 DIAGNOSIS — R079 Chest pain, unspecified: Secondary | ICD-10-CM | POA: Diagnosis not present

## 2020-09-10 DIAGNOSIS — Z6837 Body mass index (BMI) 37.0-37.9, adult: Secondary | ICD-10-CM | POA: Diagnosis not present

## 2020-09-10 DIAGNOSIS — E785 Hyperlipidemia, unspecified: Secondary | ICD-10-CM | POA: Diagnosis present

## 2020-09-10 DIAGNOSIS — R778 Other specified abnormalities of plasma proteins: Secondary | ICD-10-CM | POA: Diagnosis present

## 2020-09-10 DIAGNOSIS — Z79899 Other long term (current) drug therapy: Secondary | ICD-10-CM | POA: Diagnosis not present

## 2020-09-10 DIAGNOSIS — G44009 Cluster headache syndrome, unspecified, not intractable: Secondary | ICD-10-CM | POA: Diagnosis present

## 2020-09-10 DIAGNOSIS — I160 Hypertensive urgency: Secondary | ICD-10-CM | POA: Diagnosis not present

## 2020-09-10 DIAGNOSIS — I714 Abdominal aortic aneurysm, without rupture: Secondary | ICD-10-CM | POA: Diagnosis present

## 2020-09-10 DIAGNOSIS — E669 Obesity, unspecified: Secondary | ICD-10-CM | POA: Diagnosis present

## 2020-09-10 DIAGNOSIS — R519 Headache, unspecified: Secondary | ICD-10-CM | POA: Diagnosis not present

## 2020-09-10 LAB — HEPATIC FUNCTION PANEL
ALT: 21 U/L (ref 0–44)
AST: 19 U/L (ref 15–41)
Albumin: 3.7 g/dL (ref 3.5–5.0)
Alkaline Phosphatase: 54 U/L (ref 38–126)
Bilirubin, Direct: 0.2 mg/dL (ref 0.0–0.2)
Indirect Bilirubin: 0.8 mg/dL (ref 0.3–0.9)
Total Bilirubin: 1 mg/dL (ref 0.3–1.2)
Total Protein: 7.6 g/dL (ref 6.5–8.1)

## 2020-09-10 LAB — BASIC METABOLIC PANEL
Anion gap: 7 (ref 5–15)
BUN: 13 mg/dL (ref 6–20)
CO2: 27 mmol/L (ref 22–32)
Calcium: 9.4 mg/dL (ref 8.9–10.3)
Chloride: 103 mmol/L (ref 98–111)
Creatinine, Ser: 1.08 mg/dL — ABNORMAL HIGH (ref 0.44–1.00)
GFR, Estimated: >60 mL/min (ref >60)
Glucose, Bld: 114 mg/dL — ABNORMAL HIGH (ref 70–99)
Potassium: 3.5 mmol/L (ref 3.5–5.1)
Sodium: 137 mmol/L (ref 135–145)

## 2020-09-10 LAB — CBC
HCT: 36.1 % (ref 36.0–46.0)
Hemoglobin: 12.1 g/dL (ref 12.0–15.0)
MCH: 29.5 pg (ref 26.0–34.0)
MCHC: 33.5 g/dL (ref 30.0–36.0)
MCV: 88 fL (ref 80.0–100.0)
Platelets: 294 10*3/uL (ref 150–400)
RBC: 4.1 MIL/uL (ref 3.87–5.11)
RDW: 13.8 % (ref 11.5–15.5)
WBC: 6.4 10*3/uL (ref 4.0–10.5)
nRBC: 0 % (ref 0.0–0.2)

## 2020-09-10 LAB — RAPID URINE DRUG SCREEN, HOSP PERFORMED
Amphetamines: NOT DETECTED
Barbiturates: NOT DETECTED
Benzodiazepines: NOT DETECTED
Cocaine: NOT DETECTED
Opiates: POSITIVE — AB
Tetrahydrocannabinol: NOT DETECTED

## 2020-09-10 LAB — URINALYSIS, ROUTINE W REFLEX MICROSCOPIC
Bilirubin Urine: NEGATIVE
Glucose, UA: NEGATIVE mg/dL
Hgb urine dipstick: NEGATIVE
Ketones, ur: NEGATIVE mg/dL
Nitrite: NEGATIVE
Protein, ur: 30 mg/dL — AB
Specific Gravity, Urine: 1.031 — ABNORMAL HIGH (ref 1.005–1.030)
pH: 5 (ref 5.0–8.0)

## 2020-09-10 LAB — LIPASE, BLOOD: Lipase: 47 U/L (ref 11–51)

## 2020-09-10 MED ORDER — HYDRALAZINE HCL 50 MG PO TABS
75.0000 mg | ORAL_TABLET | Freq: Three times a day (TID) | ORAL | Status: DC
  Administered 2020-09-10 – 2020-09-11 (×2): 75 mg via ORAL
  Filled 2020-09-10 (×2): qty 1

## 2020-09-10 MED ORDER — HYDRALAZINE HCL 25 MG PO TABS
25.0000 mg | ORAL_TABLET | ORAL | Status: AC
  Administered 2020-09-10: 25 mg via ORAL
  Filled 2020-09-10: qty 1

## 2020-09-10 MED ORDER — SODIUM CHLORIDE 0.9 % IV SOLN: INTRAVENOUS | Status: DC

## 2020-09-10 MED ORDER — BUTALBITAL-APAP-CAFFEINE 50-325-40 MG PO TABS
1.0000 | ORAL_TABLET | ORAL | Status: DC | PRN
  Administered 2020-09-10: 1 via ORAL
  Filled 2020-09-10: qty 1

## 2020-09-10 MED ORDER — SODIUM CHLORIDE 0.9 % IV SOLN
1.0000 g | INTRAVENOUS | Status: DC
  Administered 2020-09-10 – 2020-09-11 (×2): 1 g via INTRAVENOUS
  Filled 2020-09-10 (×2): qty 10

## 2020-09-10 MED ORDER — SODIUM CHLORIDE 0.9 % IV SOLN
12.5000 mg | Freq: Once | INTRAVENOUS | Status: AC
  Administered 2020-09-10: 12.5 mg via INTRAVENOUS
  Filled 2020-09-10: qty 0.5

## 2020-09-10 NOTE — Progress Notes (Signed)
PROGRESS NOTE                                                                             PROGRESS NOTE                                                                                                                                                                                                             Patient Demographics:    Debbie Davidson, is a 50 y.o. female, DOB - August 19, 1970, OZH:[id number]  Outpatient Primary MD for the patient is Blount, Viviana Simpler, MD (Inactive)    LOS - 0  Admit date - 09/09/2020    Chief Complaint  Patient presents with  . Chest Pain  . Headache       Brief Narrative    HPI: Debbie Davidson is a 50 y.o. female with medical history significant of hypertension, AAA, and obesity presents with complaints of chest pain and headache.  Patient just recently been hospitalized from 3/28-3/31 for atypical chest pain and abdominal pain.  Patient underwent CT of the chest with no evidence of coronary calcification and echocardiogram revealed EF of 50 to 55% with no regional wall motion abnormalities.  Cardiology formally evaluated patient and recommended outpatient CT coronary study was recommended as an outpatient.  After getting home patient reports that she felt no better.  She reports taking medications as prescribed, but noted that systolic blood pressures range from 130-170s.  Yesterday evening however her systolic blood pressures were elevated up to 222.  Noted to have left substernal chest sharp pain and tightness.  Associated symptoms include having headache with blurry vision, malaise, and epigastric abdominal pain.  Denies any focal weakness to her knowledge.  Since being here in the emergency department patient reports that she has had at least 3 episodes of nausea and vomiting.  ED Course: Upon admission into the emergency department patient was seen to be afebrile, pulse 53-76, respirations 12-25, blood pressures elevated  up to 195/111, and O2 saturations maintained on room air.  Labs significant  for CBC within normal limits, hemoglobin 12.3, BUN 15, creatinine 1.06, and high-sensitivity troponin 20->17.  Influenza and COVID-19 screening were negative.  Patient was given labetalol 20 mg IV, 4 mg of morphine, Zofran.  TRH called to admit.    Subjective:    Debbie Davidson today still reports headache, nausea and vomiting, poor appetite, complaining of abdominal pain    Assessment  & Plan :    Principal Problem:   Hypertensive urgency Active Problems:   Abdominal pain, acute, epigastric   Nausea & vomiting   Chest pain   Headache   Hyperlipidemia  Hypertensive urgency: -  Acute.  Patient presented with complaints of blood pressures elevated up to 222 at home.  -Patient regimen has been adjusted, continue with Coreg 6.25 mg p.o. twice daily (unable to increase as her heart rate remains in the 60s), increase hydralazine to 75 mg oral 3 times daily. -Nausea vomiting, contributing to her uncontrolled blood pressure today as she did vomit her morning tablets. -As well her headache has been contributing to her uncontrolled blood pressure.  Nausea/vomiting/abdominal pain -Acute on chronic, recently hospitalized with CT abdomen pelvis with no acute findings. -Remains significant, which on as needed Zofran, will give one-time Phenergan, will start on IV fluids. -Lipase within normal limits, will check right upper quadrant ultrasound.  Headache -Clinical  suspicious for cluster headache, she was started on high flow oxygen, will add Fioricet as well.Marland Kitchen  UTI -Continue with Rocephin  Chest pain elevated troponin: Patient reported having substernal chest pain similar to last admission when blood pressures elevated.  High-sensitivity troponins 20->17.  EKG without significant ischemic changes.  Patient has been recommended to follow-up with cardiology in the outpatient setting for a coronary CT. -Continue  outpatient follow-up with cardiology  Hyperlipidemia -Continue atorvastatin 20 mg daily  GERD  -Changed PPI to Protonix IV  -Continue Carafate once able   SpO2: 99 % O2 Flow Rate (L/min): 2 L/min  Recent Labs  Lab 09/09/20 0359 09/09/20 0600 09/10/20 0252 09/10/20 1013  WBC 6.2  --  6.4  --   PLT 297  --  294  --   AST 19  --   --  19  ALT 23  --   --  21  ALKPHOS 52  --   --  54  BILITOT 0.6  --   --  1.0  ALBUMIN 3.6  --   --  3.7  SARSCOV2NAA  --  NEGATIVE  --   --        ABG     Component Value Date/Time   TCO2 25 12/11/2012 1907         Condition - Extremely Guarded  Family Communication  : Discussed with son and daughter at bedside  Code Status :  full  Consults  :  None  Procedures  :  none  Disposition Plan  :    Status is: Observation  The patient will require care spanning > 2 midnights and should be moved to inpatient because: IV treatments appropriate due to intensity of illness or inability to take PO  Dispo: The patient is from: Home              Anticipated d/c is to: Home              Patient currently is not medically stable to d/c.  Still needs further work-up for her nausea and vomiting, intermittent significant nausea and vomiting requiring IV medications, and IV fluids.  Difficult to place patient No      DVT Prophylaxis  :  Lovenox -   Lab Results  Component Value Date   PLT 294 09/10/2020    Diet :  Diet Order            Diet Heart Room service appropriate? Yes; Fluid consistency: Thin  Diet effective now                  Inpatient Medications  Scheduled Meds: . atorvastatin  20 mg Oral Daily  . carvedilol  6.25 mg Oral BID WC  . enoxaparin (LOVENOX) injection  50 mg Subcutaneous Q24H  . hydrALAZINE  75 mg Oral Q8H  . loratadine  10 mg Oral Daily  . pantoprazole (PROTONIX) IV  40 mg Intravenous Q12H  . sodium chloride flush  3 mL Intravenous Q12H  . sucralfate  1 g Oral TID WC & HS   Continuous  Infusions: . sodium chloride    . cefTRIAXone (ROCEPHIN)  IV    . promethazine (PHENERGAN) injection     PRN Meds:.acetaminophen **OR** acetaminophen, albuterol, butalbital-acetaminophen-caffeine, hydrALAZINE, ondansetron (ZOFRAN) IV  Antibiotics  :    Anti-infectives (From admission, onward)   Start     Dose/Rate Route Frequency Ordered Stop   09/10/20 1530  cefTRIAXone (ROCEPHIN) 1 g in sodium chloride 0.9 % 100 mL IVPB        1 g 200 mL/hr over 30 Minutes Intravenous Every 24 hours 09/10/20 1440           Huey Bienenstockawood Myosha Cuadras M.D on 09/10/2020 at 2:54 PM  To page go to [email or web address] [facility] -  Office  [phone]      Objective:   Vitals:   09/10/20 0714 09/10/20 0800 09/10/20 1145 09/10/20 1300  BP: (!) 166/95 131/73 (!) 154/75 (!) 148/79  Pulse: 71 76 (!) 53 73  Resp: 19  18   Temp:   98.8 F (37.1 C)   TempSrc:   Oral   SpO2: 95% 95% 100% 99%  Weight:      Height:        Wt Readings from Last 3 Encounters:  09/09/20 100.3 kg  09/03/20 100.3 kg  08/02/12 109.1 kg     Intake/Output Summary (Last 24 hours) at 09/10/2020 1454 Last data filed at 09/10/2020 0849 Gross per 24 hour  Intake 0 ml  Output 250 ml  Net -250 ml     Physical Exam  Awake Alert, No new F.N deficits, Normal affect, she looks uncomfortable due to nausea and vomiting. Symmetrical Chest wall movement, Good air movement bilaterally, CTAB RRR,No Gallops,Rubs or new Murmurs, No Parasternal Heave +ve B.Sounds, Abd Soft,she has abd tenderness, No rebound - guarding or rigidity. No Cyanosis, Clubbing or edema, No new Rash or bruise      Data Review:    CBC Recent Labs  Lab 09/09/20 0359 09/10/20 0252  WBC 6.2 6.4  HGB 12.3 12.1  HCT 37.2 36.1  PLT 297 294  MCV 90.5 88.0  MCH 29.9 29.5  MCHC 33.1 33.5  RDW 13.9 13.8  LYMPHSABS 2.3  --   MONOABS 0.4  --   EOSABS 0.1  --   BASOSABS 0.0  --     Recent Labs  Lab 09/09/20 0359 09/10/20 0252 09/10/20 1013  NA 138  137  --   K 3.7 3.5  --   CL 104 103  --   CO2 27 27  --   GLUCOSE 124* 114*  --  BUN 15 13  --   CREATININE 1.06* 1.08*  --   CALCIUM 9.3 9.4  --   AST 19  --  19  ALT 23  --  21  ALKPHOS 52  --  54  BILITOT 0.6  --  1.0  ALBUMIN 3.6  --  3.7    ------------------------------------------------------------------------------------------------------------------ No results for input(s): CHOL, HDL, LDLCALC, TRIG, CHOLHDL, LDLDIRECT in the last 72 hours.  No results found for: HGBA1C ------------------------------------------------------------------------------------------------------------------ No results for input(s): TSH, T4TOTAL, T3FREE, THYROIDAB in the last 72 hours.  Invalid input(s): FREET3  Cardiac Enzymes No results for input(s): CKMB, TROPONINI, MYOGLOBIN in the last 168 hours.  Invalid input(s): CK ------------------------------------------------------------------------------------------------------------------ No results found for: BNP  Micro Results Recent Results (from the past 240 hour(s))  Resp Panel by RT-PCR (Flu A&B, Covid) Nasopharyngeal Swab     Status: None   Collection Time: 08/31/20  3:39 PM   Specimen: Nasopharyngeal Swab; Nasopharyngeal(NP) swabs in vial transport medium  Result Value Ref Range Status   SARS Coronavirus 2 by RT PCR NEGATIVE NEGATIVE Final    Comment: (NOTE) SARS-CoV-2 target nucleic acids are NOT DETECTED.  The SARS-CoV-2 RNA is generally detectable in upper respiratory specimens during the acute phase of infection. The lowest concentration of SARS-CoV-2 viral copies this assay can detect is 138 copies/mL. A negative result does not preclude SARS-Cov-2 infection and should not be used as the sole basis for treatment or other patient management decisions. A negative result may occur with  improper specimen collection/handling, submission of specimen other than nasopharyngeal swab, presence of viral mutation(s) within the areas  targeted by this assay, and inadequate number of viral copies(<138 copies/mL). A negative result must be combined with clinical observations, patient history, and epidemiological information. The expected result is Negative.  Fact Sheet for Patients:  BloggerCourse.com  Fact Sheet for Healthcare Providers:  SeriousBroker.it  This test is no t yet approved or cleared by the [place] FDA and  has been authorized for detection and/or diagnosis of SARS-CoV-2 by FDA under an Emergency Use Authorization (EUA). This EUA will remain  in effect (meaning this test can be used) for the duration of the COVID-19 declaration under Section 564(b)(1) of the Act, 21 U.S.C.section 360bbb-3(b)(1), unless the authorization is terminated  or revoked sooner.       Influenza A by PCR NEGATIVE NEGATIVE Final   Influenza B by PCR NEGATIVE NEGATIVE Final    Comment: (NOTE) The Xpert Xpress SARS-CoV-2/FLU/RSV plus assay is intended as an aid in the diagnosis of influenza from Nasopharyngeal swab specimens and should not be used as a sole basis for treatment. Nasal washings and aspirates are unacceptable for Xpert Xpress SARS-CoV-2/FLU/RSV testing.  Fact Sheet for Patients: BloggerCourse.com  Fact Sheet for Healthcare Providers: SeriousBroker.it  This test is not yet approved or cleared by the [place] FDA and has been authorized for detection and/or diagnosis of SARS-CoV-2 by FDA under an Emergency Use Authorization (EUA). This EUA will remain in effect (meaning this test can be used) for the duration of the COVID-19 declaration under Section 564(b)(1) of the Act, 21 U.S.C. section 360bbb-3(b)(1), unless the authorization is terminated or revoked.  Performed at [facility], [address], [place], [place] [address]   Resp Panel by RT-PCR (Flu A&B, Covid) Nasopharyngeal Swab      Status: None   Collection Time: 09/09/20  6:00 AM   Specimen: Nasopharyngeal Swab; Nasopharyngeal(NP) swabs in vial transport medium  Result Value Ref Range  Status   SARS Coronavirus 2 by RT PCR NEGATIVE NEGATIVE Final    Comment: (NOTE) SARS-CoV-2 target nucleic acids are NOT DETECTED.  The SARS-CoV-2 RNA is generally detectable in upper respiratory specimens during the acute phase of infection. The lowest concentration of SARS-CoV-2 viral copies this assay can detect is 138 copies/mL. A negative result does not preclude SARS-Cov-2 infection and should not be used as the sole basis for treatment or other patient management decisions. A negative result may occur with  improper specimen collection/handling, submission of specimen other than nasopharyngeal swab, presence of viral mutation(s) within the areas targeted by this assay, and inadequate number of viral copies(<138 copies/mL). A negative result must be combined with clinical observations, patient history, and epidemiological information. The expected result is Negative.  Fact Sheet for Patients:  BloggerCourse.com  Fact Sheet for Healthcare Providers:  SeriousBroker.it  This test is no t yet approved or cleared by the [place] FDA and  has been authorized for detection and/or diagnosis of SARS-CoV-2 by FDA under an Emergency Use Authorization (EUA). This EUA will remain  in effect (meaning this test can be used) for the duration of the COVID-19 declaration under Section 564(b)(1) of the Act, 21 U.S.C.section 360bbb-3(b)(1), unless the authorization is terminated  or revoked sooner.       Influenza A by PCR NEGATIVE NEGATIVE Final   Influenza B by PCR NEGATIVE NEGATIVE Final    Comment: (NOTE) The Xpert Xpress SARS-CoV-2/FLU/RSV plus assay is intended as an aid in the diagnosis of influenza from Nasopharyngeal swab specimens and should not be used as a sole basis  for treatment. Nasal washings and aspirates are unacceptable for Xpert Xpress SARS-CoV-2/FLU/RSV testing.  Fact Sheet for Patients: BloggerCourse.com  Fact Sheet for Healthcare Providers: SeriousBroker.it  This test is not yet approved or cleared by the [place] FDA and has been authorized for detection and/or diagnosis of SARS-CoV-2 by FDA under an Emergency Use Authorization (EUA). This EUA will remain in effect (meaning this test can be used) for the duration of the COVID-19 declaration under Section 564(b)(1) of the Act, 21 U.S.C. section 360bbb-3(b)(1), unless the authorization is terminated or revoked.  Performed at [facility], [address] [place], [place] [address]     Radiology Reports CT HEAD WO CONTRAST  Result Date: 08/31/2020 CLINICAL DATA:  Stroke-like symptoms EXAM: CT HEAD WITHOUT CONTRAST TECHNIQUE: Contiguous axial images were obtained from the base of the skull through the vertex without intravenous contrast. COMPARISON:  08/04/2018 FINDINGS: Brain: No evidence of acute infarction, hemorrhage, hydrocephalus, extra-axial collection or mass lesion/mass effect. Vascular: No hyperdense vessel or unexpected calcification. Skull: Normal. Negative for fracture or focal lesion. Sinuses/Orbits: No acute finding. Other: None. IMPRESSION: No acute abnormality noted. Electronically Signed   By: Alcide Clever M.D.   On: 08/31/2020 19:30   DG CHEST PORT 1 VIEW  Result Date: 09/09/2020 CLINICAL DATA:  Chest pain. EXAM: PORTABLE CHEST 1 VIEW COMPARISON:  08/30/2020 FINDINGS: 1044 hours. Low volume film. The cardio pericardial silhouette is enlarged. The lungs are clear without focal pneumonia, edema, pneumothorax or pleural effusion. The visualized bony structures of the thorax show no acute abnormality. Telemetry leads overlie the chest. IMPRESSION: Low volume film without acute cardiopulmonary findings. Electronically  Signed   By: Kennith Center M.D.   On: 09/09/2020 11:09   DG Chest Port 1 View  Result Date: 08/30/2020 CLINICAL DATA:  Shortness of breath and chest pain nausea and vomiting. EXAM: PORTABLE CHEST 1 VIEW  COMPARISON:  08/04/2018 FINDINGS: The heart size and mediastinal contours are within normal limits. Both lungs are clear. The visualized skeletal structures are unremarkable. IMPRESSION: No active disease. Electronically Signed   By: Signa Kell M.D.   On: 08/30/2020 13:32   DG ABD ACUTE 2+V W 1V CHEST  Result Date: 08/31/2020 CLINICAL DATA:  Abdomen pain with nausea EXAM: DG ABDOMEN ACUTE WITH 1 VIEW CHEST COMPARISON:  08/01/2012, 08/30/2020 FINDINGS: Single view chest demonstrates borderline to mild cardiomegaly. No focal opacity or pleural effusion. No pneumothorax. Supine and upright views of the abdomen demonstrate no free air beneath the diaphragm. Nonobstructed gas pattern. No pathologic calcifications. IMPRESSION: Negative abdominal radiographs. No acute cardiopulmonary disease. Borderline to mild cardiomegaly. Electronically Signed   By: Jasmine Pang M.D.   On: 08/31/2020 19:05   ECHOCARDIOGRAM COMPLETE  Result Date: 09/01/2020    ECHOCARDIOGRAM REPORT   Patient Name:   MALIK RUFFINO Date of Exam: 09/01/2020 Medical Rec #:  [id number]       Height:       64.0 in Accession #:    [id number]      Weight:       190.0 lb Date of Birth:  June 09, 1970       BSA:          1.914 m Patient Age:    49 years        BP:           151/103 mmHg Patient Gender: F               HR:           53 bpm. Exam Location:  Inpatient Procedure: 2D Echo, Cardiac Doppler and Color Doppler Indications:    R07.9* Chest pain, unspecified  History:        Patient has no prior history of Echocardiogram examinations.                 Risk Factors:Hypertension.  Sonographer:    Elmarie Shiley Dance Referring Phys: [id number] PRANAV M PATEL IMPRESSIONS  1. Left ventricular ejection fraction, by estimation, is 55 to 60%. The left ventricle  has normal function. The left ventricle has no regional wall motion abnormalities. There is moderate concentric left ventricular hypertrophy. Left ventricular diastolic parameters are consistent with Grade I diastolic dysfunction (impaired relaxation).  2. Right ventricular systolic function is normal. The right ventricular size is normal.  3. Left atrial size was mildly dilated.  4. The mitral valve is normal in structure. No evidence of mitral valve regurgitation. No evidence of mitral stenosis.  5. The aortic valve is tricuspid. Aortic valve regurgitation is not visualized. No aortic stenosis is present.  6. Aortic dilatation noted. There is mild dilatation of the ascending aorta, measuring 39 mm.  7. The inferior vena cava is normal in size with greater than 50% respiratory variability, suggesting right atrial pressure of 3 mmHg. FINDINGS  Left Ventricle: Left ventricular ejection fraction, by estimation, is 55 to 60%. The left ventricle has normal function. The left ventricle has no regional wall motion abnormalities. The left ventricular internal cavity size was normal in size. There is  moderate concentric left ventricular hypertrophy. Left ventricular diastolic parameters are consistent with Grade I diastolic dysfunction (impaired relaxation). Indeterminate filling pressures. Right Ventricle: The right ventricular size is normal. No increase in right ventricular wall thickness. Right ventricular systolic function is normal. Left Atrium: Left atrial size was mildly dilated. Right Atrium: Right atrial size was normal in size.  Pericardium: There is no evidence of pericardial effusion. Mitral Valve: The mitral valve is normal in structure. No evidence of mitral valve regurgitation. No evidence of mitral valve stenosis. Tricuspid Valve: The tricuspid valve is normal in structure. Tricuspid valve regurgitation is not demonstrated. No evidence of tricuspid stenosis. Aortic Valve: The aortic valve is tricuspid.  Aortic valve regurgitation is not visualized. No aortic stenosis is present. Pulmonic Valve: The pulmonic valve was normal in structure. Pulmonic valve regurgitation is trivial. No evidence of pulmonic stenosis. Aorta: Aortic dilatation noted. There is mild dilatation of the ascending aorta, measuring 39 mm. Venous: The inferior vena cava is normal in size with greater than 50% respiratory variability, suggesting right atrial pressure of 3 mmHg. IAS/Shunts: No atrial level shunt detected by color flow Doppler.  LEFT VENTRICLE PLAX 2D LVIDd:         4.00 cm  Diastology LVIDs:         2.90 cm  LV e' medial:    4.13 cm/s LV PW:         1.64 cm  LV E/e' medial:  13.3 LV IVS:        1.46 cm  LV e' lateral:   4.90 cm/s LVOT diam:     2.30 cm  LV E/e' lateral: 11.2 LV SV:         78 LV SV Index:   41 LVOT Area:     4.15 cm  RIGHT VENTRICLE             IVC RV Basal diam:  2.10 cm     IVC diam: 1.60 cm RV S prime:     16.50 cm/s TAPSE (M-mode): 2.1 cm LEFT ATRIUM             Index       RIGHT ATRIUM           Index LA diam:        2.90 cm 1.52 cm/m  RA Area:     11.80 cm LA Vol (A2C):   78.0 ml 40.75 ml/m RA Volume:   22.40 ml  11.70 ml/m LA Vol (A4C):   39.2 ml 20.48 ml/m LA Biplane Vol: 56.1 ml 29.31 ml/m  AORTIC VALVE LVOT Vmax:   96.85 cm/s LVOT Vmean:  64.350 cm/s LVOT VTI:    0.188 m  AORTA Ao Root diam: 3.40 cm Ao Asc diam:  3.90 cm MITRAL VALVE MV Area (PHT): 2.01 cm    SHUNTS MV Decel Time: 377 msec    Systemic VTI:  0.19 m MV E velocity: 54.80 cm/s  Systemic Diam: 2.30 cm MV A velocity: 84.40 cm/s MV E/A ratio:  0.65 Chilton Si MD Electronically signed by Chilton Si MD Signature Date/Time: 09/01/2020/3:35:39 PM    Final    CT Angio Chest/Abd/Pel for Dissection W and/or Wo Contrast  Result Date: 08/30/2020 CLINICAL DATA:  Chest pain and shortness of breath. Suspected aortic aneurysm EXAM: CT ANGIOGRAPHY CHEST, ABDOMEN AND PELVIS TECHNIQUE: Non-contrast CT of the chest was initially obtained.  Multidetector CT imaging through the chest, abdomen and pelvis was performed using the standard protocol during bolus administration of intravenous contrast. Multiplanar reconstructed images and MIPs were obtained and reviewed to evaluate the vascular anatomy. CONTRAST:  OMNIPAQUE IOHEXOL 350 MG/ML SOLN COMPARISON:  CT abdomen pelvis 08/01/2012 FINDINGS: CTA CHEST FINDINGS Cardiovascular: Noncontrast CT of the chest demonstrates no evidence of aortic intramural hematoma. Preferential opacification of the thoracic aorta. No evidence of thoracic aortic dissection. Three vessel  arch with widely patent branch vessels. Thoracic aortic measurements as follows: Sinuses of Valsalva: 4.0 cm Mid ascending thoracic aorta: 4.4 x 4.1 cm Proximal arch: 3.5 cm Distal arch: 2.8 cm Mid descending: 2.3 cm Pulmonary vasculature appears within normal limits. No central filling defect. Heart size is mildly enlarged. No pericardial effusion. Mediastinum/Nodes: No enlarged mediastinal, hilar, or axillary lymph nodes. Thyroid gland, trachea, and esophagus demonstrate no significant findings. Lungs/Pleura: Patchy dependent bibasilar opacities, favor atelectasis. No pleural effusion. No pneumothorax. Musculoskeletal: No chest wall abnormality. No acute or significant osseous findings. Review of the MIP images confirms the above findings. CTA ABDOMEN AND PELVIS FINDINGS VASCULAR Aorta: Normal caliber aorta without aneurysm, dissection, vasculitis or significant stenosis. Scattered aortic atherosclerosis. Celiac: Patent without evidence of aneurysm, dissection, vasculitis or significant stenosis. SMA: Patent without evidence of aneurysm, dissection, vasculitis or significant stenosis. Renals: Paired bilateral renal arteries are patent without evidence of aneurysm, dissection, vasculitis, fibromuscular dysplasia or significant stenosis. IMA: Patent. Inflow: Patent without evidence of aneurysm, dissection, vasculitis or significant  stenosis. Veins: No obvious venous abnormality within the limitations of this arterial phase study. Review of the MIP images confirms the above findings. NON-VASCULAR Hepatobiliary: No focal liver abnormality is seen. No gallstones, gallbladder wall thickening, or biliary dilatation. Pancreas: Unremarkable. No pancreatic ductal dilatation or surrounding inflammatory changes. Spleen: Normal in size without focal abnormality. Adrenals/Urinary Tract: Unremarkable adrenal glands. Kidneys enhance symmetrically without focal lesion, stone, or hydronephrosis. Ureters are nondilated. Faint layering hyperdensity within the bladder lumen favored to reflect early contrast excretion. Bladder is otherwise unremarkable. Stomach/Bowel: Stomach is within normal limits. Appendix appears normal. No evidence of bowel wall thickening, distention, or inflammatory changes. Lymphatic: No abdominopelvic lymphadenopathy. Reproductive: Uterus and bilateral adnexa are unremarkable. Other: No free fluid. No abdominopelvic fluid collection. No pneumoperitoneum. No abdominal wall hernia. Musculoskeletal: No acute or significant osseous findings. Review of the MIP images confirms the above findings. IMPRESSION: 1. Ascending thoracic aortic aneurysm measuring up to 4.4 cm. Recommend annual imaging followup by CTA or MRA. This recommendation follows 2010 ACCF/AHA/AATS/ACR/ASA/SCA/SCAI/SIR/STS/SVM Guidelines for the Diagnosis and Management of Patients with Thoracic Aortic Disease. Circulation. 2010; 121: X448-J856. Aortic aneurysm NOS (ICD10-I71.9) 2. Patchy dependent bibasilar opacities, favor atelectasis. Superimposed pneumonia would be difficult to exclude. 3. Mild cardiomegaly. 4. No acute findings abdomen or pelvis. Electronically Signed   By: Duanne Guess D.O.   On: 08/30/2020 15:36

## 2020-09-11 LAB — COMPREHENSIVE METABOLIC PANEL
ALT: 20 U/L (ref 0–44)
AST: 16 U/L (ref 15–41)
Albumin: 3.2 g/dL — ABNORMAL LOW (ref 3.5–5.0)
Alkaline Phosphatase: 45 U/L (ref 38–126)
Anion gap: 8 (ref 5–15)
BUN: 15 mg/dL (ref 6–20)
CO2: 28 mmol/L (ref 22–32)
Calcium: 9 mg/dL (ref 8.9–10.3)
Chloride: 102 mmol/L (ref 98–111)
Creatinine, Ser: 1.55 mg/dL — ABNORMAL HIGH (ref 0.44–1.00)
GFR, Estimated: 41 mL/min — ABNORMAL LOW (ref >60)
Glucose, Bld: 136 mg/dL — ABNORMAL HIGH (ref 70–99)
Potassium: 3.3 mmol/L — ABNORMAL LOW (ref 3.5–5.1)
Sodium: 138 mmol/L (ref 135–145)
Total Bilirubin: 0.7 mg/dL (ref 0.3–1.2)
Total Protein: 6.4 g/dL — ABNORMAL LOW (ref 6.5–8.1)

## 2020-09-11 LAB — CBC
HCT: 34.1 % — ABNORMAL LOW (ref 36.0–46.0)
Hemoglobin: 11.3 g/dL — ABNORMAL LOW (ref 12.0–15.0)
MCH: 29.6 pg (ref 26.0–34.0)
MCHC: 33.1 g/dL (ref 30.0–36.0)
MCV: 89.3 fL (ref 80.0–100.0)
Platelets: 274 10*3/uL (ref 150–400)
RBC: 3.82 MIL/uL — ABNORMAL LOW (ref 3.87–5.11)
RDW: 13.8 % (ref 11.5–15.5)
WBC: 6.7 10*3/uL (ref 4.0–10.5)
nRBC: 0 % (ref 0.0–0.2)

## 2020-09-11 MED ORDER — MORPHINE SULFATE (PF) 2 MG/ML IV SOLN
2.0000 mg | INTRAVENOUS | Status: AC | PRN
  Administered 2020-09-11 – 2020-09-12 (×2): 2 mg via INTRAVENOUS
  Filled 2020-09-11 (×2): qty 1

## 2020-09-11 MED ORDER — HYDRALAZINE HCL 25 MG PO TABS
25.0000 mg | ORAL_TABLET | Freq: Three times a day (TID) | ORAL | Status: DC
  Administered 2020-09-11 – 2020-09-12 (×2): 25 mg via ORAL
  Filled 2020-09-11 (×2): qty 1

## 2020-09-11 MED ORDER — POLYETHYLENE GLYCOL 3350 17 G PO PACK
17.0000 g | PACK | Freq: Every day | ORAL | Status: DC
  Administered 2020-09-11 – 2020-09-12 (×2): 17 g via ORAL
  Filled 2020-09-11 (×2): qty 1

## 2020-09-11 MED ORDER — PANTOPRAZOLE SODIUM 40 MG PO TBEC
40.0000 mg | DELAYED_RELEASE_TABLET | Freq: Two times a day (BID) | ORAL | Status: DC
  Administered 2020-09-11 – 2020-09-12 (×2): 40 mg via ORAL
  Filled 2020-09-11 (×2): qty 1

## 2020-09-11 MED ORDER — VERAPAMIL HCL ER 180 MG PO TBCR
180.0000 mg | EXTENDED_RELEASE_TABLET | Freq: Every day | ORAL | Status: DC
  Administered 2020-09-11 – 2020-09-12 (×2): 180 mg via ORAL
  Filled 2020-09-11 (×2): qty 1

## 2020-09-11 NOTE — Progress Notes (Signed)
PROGRESS NOTE                                                                             PROGRESS NOTE                                                                                                                                                                                                             Patient Demographics:    Debbie Davidson, is a 50 y.o. female, DOB - May 01, 1971, ERX:[id number]  Outpatient Primary MD for the patient is Blount, Viviana Simpler, MD (Inactive)    LOS - 1  Admit date - 09/09/2020    Chief Complaint  Patient presents with  . Chest Pain  . Headache       Brief Narrative    Debbie Davidson is a 50 y.o. female with medical history significant of hypertension, ascending thoracic aneurysm, and obesity presents with complaints of chest pain and headache.    Nausea and vomiting, patient with recent hospitalization for chest pain, with negative work-up, plan to follow with cardiology as an outpatient for coronary CT , work-up was significant for hypertensive emergency, as well intractable nausea and vomiting .   Subjective:    Debbie Davidson today reports nausea and vomiting has improved, headache has improved as well.   Assessment  & Plan :    Principal Problem:   Hypertensive urgency Active Problems:   Abdominal pain, acute, epigastric   Nausea & vomiting   Chest pain   Headache   Hyperlipidemia  Hypertensive urgency: -  Acute.  Patient presented with complaints of blood pressures elevated up to 222 at home.  -Given her significant cluster headache, she will be started on verapamil CR , so I will can discontinue her Coreg and change her advertently 5 mg p.o. 3 times daily .  Nausea/vomiting/abdominal pain -Acute on chronic, recently hospitalized with CT abdomen pelvis with no acute findings. -Right upper quadrant ultrasound with no acute finding, lipase within normal limit, recent CT chest/abdomen/pelvis with no  acute abdominal  findings..   Headache -Clinical  suspicious for cluster headache. -Did show some improvement, will start on verapamil for prophylaxis.   UTI -Continue with Rocephin  AKI:  -Creatinine increased today to 1.5, continue with IV fluids, encourage her to increase fluid intake  Chest pain elevated troponin: Patient reported having substernal chest pain similar to last admission when blood pressures elevated.  High-sensitivity troponins 20->17.  EKG without significant ischemic changes.  Patient has been recommended to follow-up with cardiology in the outpatient setting for a coronary CT. -Continue outpatient follow-up with cardiology  Hyperlipidemia -Continue atorvastatin 20 mg daily  GERD  -Changed PPI to Protonix IV  -Continue Carafate once able   SpO2: 98 % O2 Flow Rate (L/min): 2 L/min  Recent Labs  Lab 09/09/20 0359 09/09/20 0600 09/10/20 0252 09/10/20 1013 09/11/20 0141  WBC 6.2  --  6.4  --  6.7  PLT 297  --  294  --  274  AST 19  --   --  19 16  ALT 23  --   --  21 20  ALKPHOS 52  --   --  54 45  BILITOT 0.6  --   --  1.0 0.7  ALBUMIN 3.6  --   --  3.7 3.2*  SARSCOV2NAA  --  NEGATIVE  --   --   --        ABG     Component Value Date/Time   TCO2 25 12/11/2012 1907         Condition - Extremely Guarded  Family Communication  : Discussed with son and daughter at bedside  Code Status :  full  Consults  :  None  Procedures  :  none  Disposition Plan  :    Status is: Observation  The patient will require care spanning > 2 midnights and should be moved to inpatient because: IV treatments appropriate due to intensity of illness or inability to take PO  Dispo: The patient is from: Home              Anticipated d/c is to: Home              Patient currently is not medically stable to d/c.  Creatinine is trending up.   Difficult to place patient No      DVT Prophylaxis  :  Lovenox -   Lab Results  Component Value Date    PLT 274 09/11/2020    Diet :  Diet Order            Diet Heart Room service appropriate? Yes; Fluid consistency: Thin  Diet effective now                  Inpatient Medications  Scheduled Meds: . atorvastatin  20 mg Oral Daily  . carvedilol  6.25 mg Oral BID WC  . enoxaparin (LOVENOX) injection  50 mg Subcutaneous Q24H  . loratadine  10 mg Oral Daily  . pantoprazole  40 mg Oral BID  . polyethylene glycol  17 g Oral Daily  . sodium chloride flush  3 mL Intravenous Q12H  . sucralfate  1 g Oral TID WC & HS  . verapamil  180 mg Oral Daily   Continuous Infusions: . sodium chloride 75 mL/hr at 09/10/20 1516  . cefTRIAXone (ROCEPHIN)  IV 1 g (09/10/20 1519)   PRN Meds:.acetaminophen **OR** acetaminophen, albuterol, butalbital-acetaminophen-caffeine, hydrALAZINE, morphine injection, ondansetron (ZOFRAN) IV  Antibiotics  :    Anti-infectives (From admission,  onward)   Start     Dose/Rate Route Frequency Ordered Stop   09/10/20 1530  cefTRIAXone (ROCEPHIN) 1 g in sodium chloride 0.9 % 100 mL IVPB        1 g 200 mL/hr over 30 Minutes Intravenous Every 24 hours 09/10/20 1440           Huey Bienenstockawood Mecca Barga M.D on 09/11/2020 at 2:11 PM  To page go to [email or web address] [facility] -  Office  [phone]      Objective:   Vitals:   09/11/20 0508 09/11/20 0854 09/11/20 1217 09/11/20 1359  BP: 135/81 128/83 (!) 151/87 128/72  Pulse:  70 (!) 57 69  Resp: 16 16 19 18   Temp: 97.8 F (36.6 C) 98.2 F (36.8 C)  98.8 F (37.1 C)  TempSrc: Oral Oral  Oral  SpO2:  95% 97% 98%  Weight:      Height:        Wt Readings from Last 3 Encounters:  09/09/20 100.3 kg  09/03/20 100.3 kg  08/02/12 109.1 kg     Intake/Output Summary (Last 24 hours) at 09/11/2020 1411 Last data filed at 09/10/2020 1800 Gross per 24 hour  Intake 283.09 ml  Output --  Net 283.09 ml     Physical Exam  Awake Alert, Oriented X 3, No new F.N deficits, Normal affect Symmetrical Chest wall  movement, Good air movement bilaterally, CTAB RRR,No Gallops,Rubs or new Murmurs, No Parasternal Heave +ve B.Sounds, Abd Soft, No tenderness, No rebound - guarding or rigidity. No Cyanosis, Clubbing or edema, No new Rash or bruise       Data Review:    CBC Recent Labs  Lab 09/09/20 0359 09/10/20 0252 09/11/20 0141  WBC 6.2 6.4 6.7  HGB 12.3 12.1 11.3*  HCT 37.2 36.1 34.1*  PLT 297 294 274  MCV 90.5 88.0 89.3  MCH 29.9 29.5 29.6  MCHC 33.1 33.5 33.1  RDW 13.9 13.8 13.8  LYMPHSABS 2.3  --   --   MONOABS 0.4  --   --   EOSABS 0.1  --   --   BASOSABS 0.0  --   --     Recent Labs  Lab 09/09/20 0359 09/10/20 0252 09/10/20 1013 09/11/20 0141  NA 138 137  --  138  K 3.7 3.5  --  3.3*  CL 104 103  --  102  CO2 27 27  --  28  GLUCOSE 124* 114*  --  136*  BUN 15 13  --  15  CREATININE 1.06* 1.08*  --  1.55*  CALCIUM 9.3 9.4  --  9.0  AST 19  --  19 16  ALT 23  --  21 20  ALKPHOS 52  --  54 45  BILITOT 0.6  --  1.0 0.7  ALBUMIN 3.6  --  3.7 3.2*    ------------------------------------------------------------------------------------------------------------------ No results for input(s): CHOL, HDL, LDLCALC, TRIG, CHOLHDL, LDLDIRECT in the last 72 hours.  No results found for: HGBA1C ------------------------------------------------------------------------------------------------------------------ No results for input(s): TSH, T4TOTAL, T3FREE, THYROIDAB in the last 72 hours.  Invalid input(s): FREET3  Cardiac Enzymes No results for input(s): CKMB, TROPONINI, MYOGLOBIN in the last 168 hours.  Invalid input(s): CK ------------------------------------------------------------------------------------------------------------------ No results found for: BNP  Micro Results Recent Results (from the past 240 hour(s))  Resp Panel by RT-PCR (Flu A&B, Covid) Nasopharyngeal Swab     Status: None   Collection Time: 09/09/20  6:00 AM   Specimen: Nasopharyngeal Swab;  Nasopharyngeal(NP) swabs in vial  transport medium  Result Value Ref Range Status   SARS Coronavirus 2 by RT PCR NEGATIVE NEGATIVE Final    Comment: (NOTE) SARS-CoV-2 target nucleic acids are NOT DETECTED.  The SARS-CoV-2 RNA is generally detectable in upper respiratory specimens during the acute phase of infection. The lowest concentration of SARS-CoV-2 viral copies this assay can detect is 138 copies/mL. A negative result does not preclude SARS-Cov-2 infection and should not be used as the sole basis for treatment or other patient management decisions. A negative result may occur with  improper specimen collection/handling, submission of specimen other than nasopharyngeal swab, presence of viral mutation(s) within the areas targeted by this assay, and inadequate number of viral copies(<138 copies/mL). A negative result must be combined with clinical observations, patient history, and epidemiological information. The expected result is Negative.  Fact Sheet for Patients:  BloggerCourse.com  Fact Sheet for Healthcare Providers:  SeriousBroker.it  This test is no t yet approved or cleared by the [place] FDA and  has been authorized for detection and/or diagnosis of SARS-CoV-2 by FDA under an Emergency Use Authorization (EUA). This EUA will remain  in effect (meaning this test can be used) for the duration of the COVID-19 declaration under Section 564(b)(1) of the Act, 21 U.S.C.section 360bbb-3(b)(1), unless the authorization is terminated  or revoked sooner.       Influenza A by PCR NEGATIVE NEGATIVE Final   Influenza B by PCR NEGATIVE NEGATIVE Final    Comment: (NOTE) The Xpert Xpress SARS-CoV-2/FLU/RSV plus assay is intended as an aid in the diagnosis of influenza from Nasopharyngeal swab specimens and should not be used as a sole basis for treatment. Nasal washings and aspirates are unacceptable for Xpert Xpress  SARS-CoV-2/FLU/RSV testing.  Fact Sheet for Patients: BloggerCourse.com  Fact Sheet for Healthcare Providers: SeriousBroker.it  This test is not yet approved or cleared by the [place] FDA and has been authorized for detection and/or diagnosis of SARS-CoV-2 by FDA under an Emergency Use Authorization (EUA). This EUA will remain in effect (meaning this test can be used) for the duration of the COVID-19 declaration under Section 564(b)(1) of the Act, 21 U.S.C. section 360bbb-3(b)(1), unless the authorization is terminated or revoked.  Performed at [facility], [address] [place], [place] [address]     Radiology Reports CT HEAD WO CONTRAST  Result Date: 08/31/2020 CLINICAL DATA:  Stroke-like symptoms EXAM: CT HEAD WITHOUT CONTRAST TECHNIQUE: Contiguous axial images were obtained from the base of the skull through the vertex without intravenous contrast. COMPARISON:  08/04/2018 FINDINGS: Brain: No evidence of acute infarction, hemorrhage, hydrocephalus, extra-axial collection or mass lesion/mass effect. Vascular: No hyperdense vessel or unexpected calcification. Skull: Normal. Negative for fracture or focal lesion. Sinuses/Orbits: No acute finding. Other: None. IMPRESSION: No acute abnormality noted. Electronically Signed   By: Alcide Clever M.D.   On: 08/31/2020 19:30   DG CHEST PORT 1 VIEW  Result Date: 09/09/2020 CLINICAL DATA:  Chest pain. EXAM: PORTABLE CHEST 1 VIEW COMPARISON:  08/30/2020 FINDINGS: 1044 hours. Low volume film. The cardio pericardial silhouette is enlarged. The lungs are clear without focal pneumonia, edema, pneumothorax or pleural effusion. The visualized bony structures of the thorax show no acute abnormality. Telemetry leads overlie the chest. IMPRESSION: Low volume film without acute cardiopulmonary findings. Electronically Signed   By: Kennith Center M.D.   On: 09/09/2020 11:09   DG Chest Port 1  View  Result Date: 08/30/2020 CLINICAL DATA:  Shortness of breath and chest pain nausea  and vomiting. EXAM: PORTABLE CHEST 1 VIEW COMPARISON:  08/04/2018 FINDINGS: The heart size and mediastinal contours are within normal limits. Both lungs are clear. The visualized skeletal structures are unremarkable. IMPRESSION: No active disease. Electronically Signed   By: Signa Kell M.D.   On: 08/30/2020 13:32   DG ABD ACUTE 2+V W 1V CHEST  Result Date: 08/31/2020 CLINICAL DATA:  Abdomen pain with nausea EXAM: DG ABDOMEN ACUTE WITH 1 VIEW CHEST COMPARISON:  08/01/2012, 08/30/2020 FINDINGS: Single view chest demonstrates borderline to mild cardiomegaly. No focal opacity or pleural effusion. No pneumothorax. Supine and upright views of the abdomen demonstrate no free air beneath the diaphragm. Nonobstructed gas pattern. No pathologic calcifications. IMPRESSION: Negative abdominal radiographs. No acute cardiopulmonary disease. Borderline to mild cardiomegaly. Electronically Signed   By: Jasmine Pang M.D.   On: 08/31/2020 19:05   ECHOCARDIOGRAM COMPLETE  Result Date: 09/01/2020    ECHOCARDIOGRAM REPORT   Patient Name:   ELIA NUNLEY Date of Exam: 09/01/2020 Medical Rec #:  [id number]       Height:       64.0 in Accession #:    [id number]      Weight:       190.0 lb Date of Birth:  08-01-1970       BSA:          1.914 m Patient Age:    49 years        BP:           151/103 mmHg Patient Gender: F               HR:           53 bpm. Exam Location:  Inpatient Procedure: 2D Echo, Cardiac Doppler and Color Doppler Indications:    R07.9* Chest pain, unspecified  History:        Patient has no prior history of Echocardiogram examinations.                 Risk Factors:Hypertension.  Sonographer:    Elmarie Shiley Dance Referring Phys: [id number] PRANAV M PATEL IMPRESSIONS  1. Left ventricular ejection fraction, by estimation, is 55 to 60%. The left ventricle has normal function. The left ventricle has no regional wall motion  abnormalities. There is moderate concentric left ventricular hypertrophy. Left ventricular diastolic parameters are consistent with Grade I diastolic dysfunction (impaired relaxation).  2. Right ventricular systolic function is normal. The right ventricular size is normal.  3. Left atrial size was mildly dilated.  4. The mitral valve is normal in structure. No evidence of mitral valve regurgitation. No evidence of mitral stenosis.  5. The aortic valve is tricuspid. Aortic valve regurgitation is not visualized. No aortic stenosis is present.  6. Aortic dilatation noted. There is mild dilatation of the ascending aorta, measuring 39 mm.  7. The inferior vena cava is normal in size with greater than 50% respiratory variability, suggesting right atrial pressure of 3 mmHg. FINDINGS  Left Ventricle: Left ventricular ejection fraction, by estimation, is 55 to 60%. The left ventricle has normal function. The left ventricle has no regional wall motion abnormalities. The left ventricular internal cavity size was normal in size. There is  moderate concentric left ventricular hypertrophy. Left ventricular diastolic parameters are consistent with Grade I diastolic dysfunction (impaired relaxation). Indeterminate filling pressures. Right Ventricle: The right ventricular size is normal. No increase in right ventricular wall thickness. Right ventricular systolic function is normal. Left Atrium: Left atrial size was mildly dilated. Right Atrium:  Right atrial size was normal in size. Pericardium: There is no evidence of pericardial effusion. Mitral Valve: The mitral valve is normal in structure. No evidence of mitral valve regurgitation. No evidence of mitral valve stenosis. Tricuspid Valve: The tricuspid valve is normal in structure. Tricuspid valve regurgitation is not demonstrated. No evidence of tricuspid stenosis. Aortic Valve: The aortic valve is tricuspid. Aortic valve regurgitation is not visualized. No aortic stenosis is  present. Pulmonic Valve: The pulmonic valve was normal in structure. Pulmonic valve regurgitation is trivial. No evidence of pulmonic stenosis. Aorta: Aortic dilatation noted. There is mild dilatation of the ascending aorta, measuring 39 mm. Venous: The inferior vena cava is normal in size with greater than 50% respiratory variability, suggesting right atrial pressure of 3 mmHg. IAS/Shunts: No atrial level shunt detected by color flow Doppler.  LEFT VENTRICLE PLAX 2D LVIDd:         4.00 cm  Diastology LVIDs:         2.90 cm  LV e' medial:    4.13 cm/s LV PW:         1.64 cm  LV E/e' medial:  13.3 LV IVS:        1.46 cm  LV e' lateral:   4.90 cm/s LVOT diam:     2.30 cm  LV E/e' lateral: 11.2 LV SV:         78 LV SV Index:   41 LVOT Area:     4.15 cm  RIGHT VENTRICLE             IVC RV Basal diam:  2.10 cm     IVC diam: 1.60 cm RV S prime:     16.50 cm/s TAPSE (M-mode): 2.1 cm LEFT ATRIUM             Index       RIGHT ATRIUM           Index LA diam:        2.90 cm 1.52 cm/m  RA Area:     11.80 cm LA Vol (A2C):   78.0 ml 40.75 ml/m RA Volume:   22.40 ml  11.70 ml/m LA Vol (A4C):   39.2 ml 20.48 ml/m LA Biplane Vol: 56.1 ml 29.31 ml/m  AORTIC VALVE LVOT Vmax:   96.85 cm/s LVOT Vmean:  64.350 cm/s LVOT VTI:    0.188 m  AORTA Ao Root diam: 3.40 cm Ao Asc diam:  3.90 cm MITRAL VALVE MV Area (PHT): 2.01 cm    SHUNTS MV Decel Time: 377 msec    Systemic VTI:  0.19 m MV E velocity: 54.80 cm/s  Systemic Diam: 2.30 cm MV A velocity: 84.40 cm/s MV E/A ratio:  0.65 Chilton Si MD Electronically signed by Chilton Si MD Signature Date/Time: 09/01/2020/3:35:39 PM    Final    CT Angio Chest/Abd/Pel for Dissection W and/or Wo Contrast  Result Date: 08/30/2020 CLINICAL DATA:  Chest pain and shortness of breath. Suspected aortic aneurysm EXAM: CT ANGIOGRAPHY CHEST, ABDOMEN AND PELVIS TECHNIQUE: Non-contrast CT of the chest was initially obtained. Multidetector CT imaging through the chest, abdomen and pelvis was  performed using the standard protocol during bolus administration of intravenous contrast. Multiplanar reconstructed images and MIPs were obtained and reviewed to evaluate the vascular anatomy. CONTRAST:  OMNIPAQUE IOHEXOL 350 MG/ML SOLN COMPARISON:  CT abdomen pelvis 08/01/2012 FINDINGS: CTA CHEST FINDINGS Cardiovascular: Noncontrast CT of the chest demonstrates no evidence of aortic intramural hematoma. Preferential opacification of the thoracic aorta. No  evidence of thoracic aortic dissection. Three vessel arch with widely patent branch vessels. Thoracic aortic measurements as follows: Sinuses of Valsalva: 4.0 cm Mid ascending thoracic aorta: 4.4 x 4.1 cm Proximal arch: 3.5 cm Distal arch: 2.8 cm Mid descending: 2.3 cm Pulmonary vasculature appears within normal limits. No central filling defect. Heart size is mildly enlarged. No pericardial effusion. Mediastinum/Nodes: No enlarged mediastinal, hilar, or axillary lymph nodes. Thyroid gland, trachea, and esophagus demonstrate no significant findings. Lungs/Pleura: Patchy dependent bibasilar opacities, favor atelectasis. No pleural effusion. No pneumothorax. Musculoskeletal: No chest wall abnormality. No acute or significant osseous findings. Review of the MIP images confirms the above findings. CTA ABDOMEN AND PELVIS FINDINGS VASCULAR Aorta: Normal caliber aorta without aneurysm, dissection, vasculitis or significant stenosis. Scattered aortic atherosclerosis. Celiac: Patent without evidence of aneurysm, dissection, vasculitis or significant stenosis. SMA: Patent without evidence of aneurysm, dissection, vasculitis or significant stenosis. Renals: Paired bilateral renal arteries are patent without evidence of aneurysm, dissection, vasculitis, fibromuscular dysplasia or significant stenosis. IMA: Patent. Inflow: Patent without evidence of aneurysm, dissection, vasculitis or significant stenosis. Veins: No obvious venous abnormality within the limitations of  this arterial phase study. Review of the MIP images confirms the above findings. NON-VASCULAR Hepatobiliary: No focal liver abnormality is seen. No gallstones, gallbladder wall thickening, or biliary dilatation. Pancreas: Unremarkable. No pancreatic ductal dilatation or surrounding inflammatory changes. Spleen: Normal in size without focal abnormality. Adrenals/Urinary Tract: Unremarkable adrenal glands. Kidneys enhance symmetrically without focal lesion, stone, or hydronephrosis. Ureters are nondilated. Faint layering hyperdensity within the bladder lumen favored to reflect early contrast excretion. Bladder is otherwise unremarkable. Stomach/Bowel: Stomach is within normal limits. Appendix appears normal. No evidence of bowel wall thickening, distention, or inflammatory changes. Lymphatic: No abdominopelvic lymphadenopathy. Reproductive: Uterus and bilateral adnexa are unremarkable. Other: No free fluid. No abdominopelvic fluid collection. No pneumoperitoneum. No abdominal wall hernia. Musculoskeletal: No acute or significant osseous findings. Review of the MIP images confirms the above findings. IMPRESSION: 1. Ascending thoracic aortic aneurysm measuring up to 4.4 cm. Recommend annual imaging followup by CTA or MRA. This recommendation follows 2010 ACCF/AHA/AATS/ACR/ASA/SCA/SCAI/SIR/STS/SVM Guidelines for the Diagnosis and Management of Patients with Thoracic Aortic Disease. Circulation. 2010; 121: A540-J811. Aortic aneurysm NOS (ICD10-I71.9) 2. Patchy dependent bibasilar opacities, favor atelectasis. Superimposed pneumonia would be difficult to exclude. 3. Mild cardiomegaly. 4. No acute findings abdomen or pelvis. Electronically Signed   By: Duanne Guess D.O.   On: 08/30/2020 15:36   US Abdomen Limited RUQ (LIVER/GB)  Result Date: 09/10/2020 CLINICAL DATA:  Abdominal pain. EXAM: ULTRASOUND ABDOMEN LIMITED RIGHT UPPER QUADRANT COMPARISON:  Ultrasound abdomen 08/04/2018 FINDINGS: Gallbladder: No gallstones  or wall thickening visualized. No sonographic Murphy sign noted by sonographer. Common bile duct: Diameter: 3 mm. Liver: No focal lesion identified. Increased in parenchymal echogenicity. Portal vein is patent on color Doppler imaging with normal direction of blood flow towards the liver. Other: None. IMPRESSION: Hepatic steatosis. Please note limited evaluation for focal hepatic masses in a patient with hepatic steatosis due to decreased penetration of the acoustic ultrasound waves. Electronically Signed   By: Tish Frederickson M.D.   On: 09/10/2020 21:36

## 2020-09-12 DIAGNOSIS — G44019 Episodic cluster headache, not intractable: Secondary | ICD-10-CM

## 2020-09-12 DIAGNOSIS — R109 Unspecified abdominal pain: Secondary | ICD-10-CM

## 2020-09-12 LAB — CBC
HCT: 32.5 % — ABNORMAL LOW (ref 36.0–46.0)
Hemoglobin: 10.7 g/dL — ABNORMAL LOW (ref 12.0–15.0)
MCH: 29.9 pg (ref 26.0–34.0)
MCHC: 32.9 g/dL (ref 30.0–36.0)
MCV: 90.8 fL (ref 80.0–100.0)
Platelets: 249 10*3/uL (ref 150–400)
RBC: 3.58 MIL/uL — ABNORMAL LOW (ref 3.87–5.11)
RDW: 13.8 % (ref 11.5–15.5)
WBC: 5.8 10*3/uL (ref 4.0–10.5)
nRBC: 0 % (ref 0.0–0.2)

## 2020-09-12 LAB — BASIC METABOLIC PANEL
Anion gap: 5 (ref 5–15)
BUN: 15 mg/dL (ref 6–20)
CO2: 27 mmol/L (ref 22–32)
Calcium: 8.9 mg/dL (ref 8.9–10.3)
Chloride: 107 mmol/L (ref 98–111)
Creatinine, Ser: 1.23 mg/dL — ABNORMAL HIGH (ref 0.44–1.00)
GFR, Estimated: 54 mL/min — ABNORMAL LOW (ref >60)
Glucose, Bld: 99 mg/dL (ref 70–99)
Potassium: 3.9 mmol/L (ref 3.5–5.1)
Sodium: 139 mmol/L (ref 135–145)

## 2020-09-12 MED ORDER — HYDRALAZINE HCL 25 MG PO TABS: 50.0000 mg | ORAL_TABLET | Freq: Three times a day (TID) | ORAL | 0 refills | Status: DC

## 2020-09-12 MED ORDER — VERAPAMIL HCL ER 180 MG PO TBCR: 180.0000 mg | EXTENDED_RELEASE_TABLET | Freq: Every day | ORAL | 0 refills | Status: DC

## 2020-09-12 NOTE — Discharge Summary (Signed)
Debbie Davidson, is a 50 y.o. female  DOB Jul 23, 1970  MRN [id number].  Admission date:  09/09/2020  Admitting Physician  Starleen Arms, MD  Discharge Date:  09/12/2020   Primary MD  Burtis Junes, MD (Inactive)  Recommendations for primary care physician for things to follow:  -Please check CBC, CMP during next visit. -With finding of ascending thoracic aortic aneurysm measuring up to 4.4 cm during previous admission,. Will need annual imaging followup by CTA or MRA.    Admission Diagnosis  Hypertensive crisis [I16.9] Hypertensive urgency [I16.0] Chest pain [R07.9]   Discharge Diagnosis  Hypertensive crisis [I16.9] Hypertensive urgency [I16.0] Chest pain [R07.9]    Principal Problem:   Hypertensive urgency Active Problems:   Abdominal pain, acute, epigastric   Nausea & vomiting   Chest pain   Headache   Hyperlipidemia      Past Medical History:  Diagnosis Date  . Hypertension   . Pain in the abdomen 08/31/2020    Past Surgical History:  Procedure Laterality Date  . TUBAL LIGATION    . WISDOM TOOTH EXTRACTION         History of present illness and  Hospital Course:     Kindly see H&P for history of present illness and admission details, please review complete Labs, Consult reports and Test reports for all details in brief  HPI  from the history and physical done on the day of admission 09/09/2020   HPI: Debbie Davidson is a 49 y.o. female with medical history significant of hypertension, AAA, and obesity presents with complaints of chest pain and headache.  Patient just recently been hospitalized from 3/28-3/31 for atypical chest pain and abdominal pain.  Patient underwent CT of the chest with no evidence of coronary calcification and echocardiogram revealed EF of 50 to 55% with no regional wall motion abnormalities.  Cardiology formally evaluated patient and recommended  outpatient CT coronary study was recommended as an outpatient.  After getting home patient reports that she felt no better.  She reports taking medications as prescribed, but noted that systolic blood pressures range from 130-170s.  Yesterday evening however her systolic blood pressures were elevated up to 222.  Noted to have left substernal chest sharp pain and tightness.  Associated symptoms include having headache with blurry vision, malaise, and epigastric abdominal pain.  Denies any focal weakness to her knowledge.  Since being here in the emergency department patient reports that she has had at least 3 episodes of nausea and vomiting.  ED Course: Upon admission into the emergency department patient was seen to be afebrile, pulse 53-76, respirations 12-25, blood pressures elevated up to 195/111, and O2 saturations maintained on room air.  Labs significant for CBC within normal limits, hemoglobin 12.3, BUN 15, creatinine 1.06, and high-sensitivity troponin 20->17.  Influenza and COVID-19 screening were negative.  Patient was given labetalol 20 mg IV, 4 mg of morphine, Zofran.  TRH called to admit.   Hospital Course    Hypertensive urgency: - Acute. Patient presented  with complaints of blood pressures elevated up to 222 at home.  -Given her significant cluster headache, she will be started on verapamil CR ,  her Coreg has been stopped given heart rate in the 60s, no ectopic bradycardia, her hydralazine has been increased to 50 mg 3 times daily, overall blood pressure has been well controlled on current regimen of verapamil and clonidine .  Nausea/vomiting/abdominal pain -Right upper quadrant ultrasound with no acute finding, lipase within normal limit, recent CT chest/abdomen/pelvis during previous with no acute abdominal findings. -This has improved after  headache has improved   Headache -Clinical  suspicious for cluster headache. -Significantly improved, continue with verapamil  prophylaxis  UTI -Treated with Rocephin  AKI:  -Creatinine increased  to 1.5 hospital stay, [name] has improved.  Chest painelevated troponin: Patient reported having substernal chest pain similar to last admission when blood pressures elevated. High-sensitivity troponins 20->17.EKG without significant ischemic changes.Patient has been recommended to follow-up with cardiology in the outpatient setting for a coronary CT. -Continue outpatient follow-up with cardiology  Hyperlipidemia -Continue atorvastatin 20 mg daily  GERD -Continue with PPI  finding of ascending thoracic aortic aneurysm measuring up to 4.4 cm during previous admission. -  Will need annual imaging followup by CTA or MRA.  Patient and family are aware of this finding portance of follow-up.    Discharge Condition:  stable   Follow UP   Follow-up Information    Blount, Viviana Simpler, MD Follow up.   Specialty: Family Medicine Contact information: [address] [address] [place] [address] [phone]        Quintella Reichert, MD .   Specialty: Cardiology Contact information: [address] [place] [address] [phone]                 Discharge Instructions  and  Discharge Medications    Discharge Instructions    Increase activity slowly   Complete by: As directed      Allergies as of 09/12/2020      Reactions   Oxycodone Nausea And Vomiting   Penicillins Nausea And Vomiting   Did it involve swelling of the face/tongue/throat, SOB, or low BP? N Did it involve sudden or severe rash/hives, skin peeling, or any reaction on the inside of your mouth or nose? N Did you need to seek medical attention at a hospital or doctor's office? N When did it last happen?Several Years Ago If all above answers are "NO", may proceed with cephalosporin use.      Medication List    STOP taking these medications   carvedilol 6.25 MG tablet Commonly known as:  COREG   HYDROcodone-acetaminophen 5-325 MG tablet Commonly known as: Norco     TAKE these medications   atorvastatin 20 MG tablet Commonly known as: LIPITOR Take 1 tablet (20 mg total) by mouth daily.   calcium carbonate 500 MG chewable tablet Commonly known as: TUMS - dosed in mg elemental calcium Chew 2 tablets by mouth daily as needed for indigestion or heartburn.   cetirizine 10 MG tablet Commonly known as: ZYRTEC Take 10 mg by mouth daily as needed for allergies.   hydrALAZINE 25 MG tablet Commonly known as: APRESOLINE Take 2 tablets (50 mg total) by mouth 3 (three) times daily. What changed:   how much to take  when to take this   multivitamin tablet Take 1 tablet by mouth daily.   omeprazole 40 MG capsule Commonly known as: PRILOSEC Take 1  capsule (40 mg total) by mouth daily.   ondansetron 4 MG disintegrating tablet Commonly known as: Zofran ODT Take 1 tablet (4 mg total) by mouth every 8 (eight) hours as needed for nausea or vomiting.   sucralfate 1 GM/10ML suspension Commonly known as: Carafate Take 10 mLs (1 g total) by mouth 4 (four) times daily -  with meals and at bedtime.   verapamil 180 MG CR tablet Commonly known as: CALAN-SR Take 1 tablet (180 mg total) by mouth daily. Start taking on: September 13, 2020         Diet and Activity recommendation: See Discharge Instructions above   Consults obtained -  none   Major procedures and Radiology Reports - PLEASE review detailed and final reports for all details, in brief -      CT HEAD WO CONTRAST  Result Date: 08/31/2020 CLINICAL DATA:  Stroke-like symptoms EXAM: CT HEAD WITHOUT CONTRAST TECHNIQUE: Contiguous axial images were obtained from the base of the skull through the vertex without intravenous contrast. COMPARISON:  08/04/2018 FINDINGS: Brain: No evidence of acute infarction, hemorrhage, hydrocephalus, extra-axial collection or mass lesion/mass effect. Vascular: No hyperdense vessel or  unexpected calcification. Skull: Normal. Negative for fracture or focal lesion. Sinuses/Orbits: No acute finding. Other: None. IMPRESSION: No acute abnormality noted. Electronically Signed   By: Alcide Clever M.D.   On: 08/31/2020 19:30   DG CHEST PORT 1 VIEW  Result Date: 09/09/2020 CLINICAL DATA:  Chest pain. EXAM: PORTABLE CHEST 1 VIEW COMPARISON:  08/30/2020 FINDINGS: 1044 hours. Low volume film. The cardio pericardial silhouette is enlarged. The lungs are clear without focal pneumonia, edema, pneumothorax or pleural effusion. The visualized bony structures of the thorax show no acute abnormality. Telemetry leads overlie the chest. IMPRESSION: Low volume film without acute cardiopulmonary findings. Electronically Signed   By: Kennith Center M.D.   On: 09/09/2020 11:09   DG Chest Port 1 View  Result Date: 08/30/2020 CLINICAL DATA:  Shortness of breath and chest pain nausea and vomiting. EXAM: PORTABLE CHEST 1 VIEW COMPARISON:  08/04/2018 FINDINGS: The heart size and mediastinal contours are within normal limits. Both lungs are clear. The visualized skeletal structures are unremarkable. IMPRESSION: No active disease. Electronically Signed   By: Signa Kell M.D.   On: 08/30/2020 13:32   DG ABD ACUTE 2+V W 1V CHEST  Result Date: 08/31/2020 CLINICAL DATA:  Abdomen pain with nausea EXAM: DG ABDOMEN ACUTE WITH 1 VIEW CHEST COMPARISON:  08/01/2012, 08/30/2020 FINDINGS: Single view chest demonstrates borderline to mild cardiomegaly. No focal opacity or pleural effusion. No pneumothorax. Supine and upright views of the abdomen demonstrate no free air beneath the diaphragm. Nonobstructed gas pattern. No pathologic calcifications. IMPRESSION: Negative abdominal radiographs. No acute cardiopulmonary disease. Borderline to mild cardiomegaly. Electronically Signed   By: Jasmine Pang M.D.   On: 08/31/2020 19:05   ECHOCARDIOGRAM COMPLETE  Result Date: 09/01/2020    ECHOCARDIOGRAM REPORT   Patient Name:   AMIEL SHARROW Date of Exam: 09/01/2020 Medical Rec #:  [id number]       Height:       64.0 in Accession #:    [id number]      Weight:       190.0 lb Date of Birth:  1971/01/24       BSA:          1.914 m Patient Age:    49 years        BP:  151/103 mmHg Patient Gender: F               HR:           53 bpm. Exam Location:  Inpatient Procedure: 2D Echo, Cardiac Doppler and Color Doppler Indications:    R07.9* Chest pain, unspecified  History:        Patient has no prior history of Echocardiogram examinations.                 Risk Factors:Hypertension.  Sonographer:    Elmarie Shileyiffany Dance Referring Phys: [id number] PRANAV M PATEL IMPRESSIONS  1. Left ventricular ejection fraction, by estimation, is 55 to 60%. The left ventricle has normal function. The left ventricle has no regional wall motion abnormalities. There is moderate concentric left ventricular hypertrophy. Left ventricular diastolic parameters are consistent with Grade I diastolic dysfunction (impaired relaxation).  2. Right ventricular systolic function is normal. The right ventricular size is normal.  3. Left atrial size was mildly dilated.  4. The mitral valve is normal in structure. No evidence of mitral valve regurgitation. No evidence of mitral stenosis.  5. The aortic valve is tricuspid. Aortic valve regurgitation is not visualized. No aortic stenosis is present.  6. Aortic dilatation noted. There is mild dilatation of the ascending aorta, measuring 39 mm.  7. The inferior vena cava is normal in size with greater than 50% respiratory variability, suggesting right atrial pressure of 3 mmHg. FINDINGS  Left Ventricle: Left ventricular ejection fraction, by estimation, is 55 to 60%. The left ventricle has normal function. The left ventricle has no regional wall motion abnormalities. The left ventricular internal cavity size was normal in size. There is  moderate concentric left ventricular hypertrophy. Left ventricular diastolic parameters are consistent with  Grade I diastolic dysfunction (impaired relaxation). Indeterminate filling pressures. Right Ventricle: The right ventricular size is normal. No increase in right ventricular wall thickness. Right ventricular systolic function is normal. Left Atrium: Left atrial size was mildly dilated. Right Atrium: Right atrial size was normal in size. Pericardium: There is no evidence of pericardial effusion. Mitral Valve: The mitral valve is normal in structure. No evidence of mitral valve regurgitation. No evidence of mitral valve stenosis. Tricuspid Valve: The tricuspid valve is normal in structure. Tricuspid valve regurgitation is not demonstrated. No evidence of tricuspid stenosis. Aortic Valve: The aortic valve is tricuspid. Aortic valve regurgitation is not visualized. No aortic stenosis is present. Pulmonic Valve: The pulmonic valve was normal in structure. Pulmonic valve regurgitation is trivial. No evidence of pulmonic stenosis. Aorta: Aortic dilatation noted. There is mild dilatation of the ascending aorta, measuring 39 mm. Venous: The inferior vena cava is normal in size with greater than 50% respiratory variability, suggesting right atrial pressure of 3 mmHg. IAS/Shunts: No atrial level shunt detected by color flow Doppler.  LEFT VENTRICLE PLAX 2D LVIDd:         4.00 cm  Diastology LVIDs:         2.90 cm  LV e' medial:    4.13 cm/s LV PW:         1.64 cm  LV E/e' medial:  13.3 LV IVS:        1.46 cm  LV e' lateral:   4.90 cm/s LVOT diam:     2.30 cm  LV E/e' lateral: 11.2 LV SV:         78 LV SV Index:   41 LVOT Area:     4.15 cm  RIGHT VENTRICLE  IVC RV Basal diam:  2.10 cm     IVC diam: 1.60 cm RV S prime:     16.50 cm/s TAPSE (M-mode): 2.1 cm LEFT ATRIUM             Index       RIGHT ATRIUM           Index LA diam:        2.90 cm 1.52 cm/m  RA Area:     11.80 cm LA Vol (A2C):   78.0 ml 40.75 ml/m RA Volume:   22.40 ml  11.70 ml/m LA Vol (A4C):   39.2 ml 20.48 ml/m LA Biplane Vol: 56.1 ml 29.31  ml/m  AORTIC VALVE LVOT Vmax:   96.85 cm/s LVOT Vmean:  64.350 cm/s LVOT VTI:    0.188 m  AORTA Ao Root diam: 3.40 cm Ao Asc diam:  3.90 cm MITRAL VALVE MV Area (PHT): 2.01 cm    SHUNTS MV Decel Time: 377 msec    Systemic VTI:  0.19 m MV E velocity: 54.80 cm/s  Systemic Diam: 2.30 cm MV A velocity: 84.40 cm/s MV E/A ratio:  0.65 Chilton Si MD Electronically signed by Chilton Si MD Signature Date/Time: 09/01/2020/3:35:39 PM    Final    CT Angio Chest/Abd/Pel for Dissection W and/or Wo Contrast  Result Date: 08/30/2020 CLINICAL DATA:  Chest pain and shortness of breath. Suspected aortic aneurysm EXAM: CT ANGIOGRAPHY CHEST, ABDOMEN AND PELVIS TECHNIQUE: Non-contrast CT of the chest was initially obtained. Multidetector CT imaging through the chest, abdomen and pelvis was performed using the standard protocol during bolus administration of intravenous contrast. Multiplanar reconstructed images and MIPs were obtained and reviewed to evaluate the vascular anatomy. CONTRAST:  OMNIPAQUE IOHEXOL 350 MG/ML SOLN COMPARISON:  CT abdomen pelvis 08/01/2012 FINDINGS: CTA CHEST FINDINGS Cardiovascular: Noncontrast CT of the chest demonstrates no evidence of aortic intramural hematoma. Preferential opacification of the thoracic aorta. No evidence of thoracic aortic dissection. Three vessel arch with widely patent branch vessels. Thoracic aortic measurements as follows: Sinuses of Valsalva: 4.0 cm Mid ascending thoracic aorta: 4.4 x 4.1 cm Proximal arch: 3.5 cm Distal arch: 2.8 cm Mid descending: 2.3 cm Pulmonary vasculature appears within normal limits. No central filling defect. Heart size is mildly enlarged. No pericardial effusion. Mediastinum/Nodes: No enlarged mediastinal, hilar, or axillary lymph nodes. Thyroid gland, trachea, and esophagus demonstrate no significant findings. Lungs/Pleura: Patchy dependent bibasilar opacities, favor atelectasis. No pleural effusion. No pneumothorax. Musculoskeletal: No  chest wall abnormality. No acute or significant osseous findings. Review of the MIP images confirms the above findings. CTA ABDOMEN AND PELVIS FINDINGS VASCULAR Aorta: Normal caliber aorta without aneurysm, dissection, vasculitis or significant stenosis. Scattered aortic atherosclerosis. Celiac: Patent without evidence of aneurysm, dissection, vasculitis or significant stenosis. SMA: Patent without evidence of aneurysm, dissection, vasculitis or significant stenosis. Renals: Paired bilateral renal arteries are patent without evidence of aneurysm, dissection, vasculitis, fibromuscular dysplasia or significant stenosis. IMA: Patent. Inflow: Patent without evidence of aneurysm, dissection, vasculitis or significant stenosis. Veins: No obvious venous abnormality within the limitations of this arterial phase study. Review of the MIP images confirms the above findings. NON-VASCULAR Hepatobiliary: No focal liver abnormality is seen. No gallstones, gallbladder wall thickening, or biliary dilatation. Pancreas: Unremarkable. No pancreatic ductal dilatation or surrounding inflammatory changes. Spleen: Normal in size without focal abnormality. Adrenals/Urinary Tract: Unremarkable adrenal glands. Kidneys enhance symmetrically without focal lesion, stone, or hydronephrosis. Ureters are nondilated. Faint layering hyperdensity within the bladder lumen favored to reflect early contrast  excretion. Bladder is otherwise unremarkable. Stomach/Bowel: Stomach is within normal limits. Appendix appears normal. No evidence of bowel wall thickening, distention, or inflammatory changes. Lymphatic: No abdominopelvic lymphadenopathy. Reproductive: Uterus and bilateral adnexa are unremarkable. Other: No free fluid. No abdominopelvic fluid collection. No pneumoperitoneum. No abdominal wall hernia. Musculoskeletal: No acute or significant osseous findings. Review of the MIP images confirms the above findings. IMPRESSION: 1. Ascending thoracic  aortic aneurysm measuring up to 4.4 cm. Recommend annual imaging followup by CTA or MRA. This recommendation follows 2010 ACCF/AHA/AATS/ACR/ASA/SCA/SCAI/SIR/STS/SVM Guidelines for the Diagnosis and Management of Patients with Thoracic Aortic Disease. Circulation. 2010; 121: B638-G536. Aortic aneurysm NOS (ICD10-I71.9) 2. Patchy dependent bibasilar opacities, favor atelectasis. Superimposed pneumonia would be difficult to exclude. 3. Mild cardiomegaly. 4. No acute findings abdomen or pelvis. Electronically Signed   By: Duanne Guess D.O.   On: 08/30/2020 15:36   US Abdomen Limited RUQ (LIVER/GB)  Result Date: 09/10/2020 CLINICAL DATA:  Abdominal pain. EXAM: ULTRASOUND ABDOMEN LIMITED RIGHT UPPER QUADRANT COMPARISON:  Ultrasound abdomen 08/04/2018 FINDINGS: Gallbladder: No gallstones or wall thickening visualized. No sonographic Murphy sign noted by sonographer. Common bile duct: Diameter: 3 mm. Liver: No focal lesion identified. Increased in parenchymal echogenicity. Portal vein is patent on color Doppler imaging with normal direction of blood flow towards the liver. Other: None. IMPRESSION: Hepatic steatosis. Please note limited evaluation for focal hepatic masses in a patient with hepatic steatosis due to decreased penetration of the acoustic ultrasound waves. Electronically Signed   By: Tish Frederickson M.D.   On: 09/10/2020 21:36    Micro Results     Recent Results (from the past 240 hour(s))  Resp Panel by RT-PCR (Flu A&B, Covid) Nasopharyngeal Swab     Status: None   Collection Time: 09/09/20  6:00 AM   Specimen: Nasopharyngeal Swab; Nasopharyngeal(NP) swabs in vial transport medium  Result Value Ref Range Status   SARS Coronavirus 2 by RT PCR NEGATIVE NEGATIVE Final    Comment: (NOTE) SARS-CoV-2 target nucleic acids are NOT DETECTED.  The SARS-CoV-2 RNA is generally detectable in upper respiratory specimens during the acute phase of infection. The lowest concentration of SARS-CoV-2 viral  copies this assay can detect is 138 copies/mL. A negative result does not preclude SARS-Cov-2 infection and should not be used as the sole basis for treatment or other patient management decisions. A negative result may occur with  improper specimen collection/handling, submission of specimen other than nasopharyngeal swab, presence of viral mutation(s) within the areas targeted by this assay, and inadequate number of viral copies(<138 copies/mL). A negative result must be combined with clinical observations, patient history, and epidemiological information. The expected result is Negative.  Fact Sheet for Patients:  BloggerCourse.com  Fact Sheet for Healthcare Providers:  SeriousBroker.it  This test is no t yet approved or cleared by the [place] FDA and  has been authorized for detection and/or diagnosis of SARS-CoV-2 by FDA under an Emergency Use Authorization (EUA). This EUA will remain  in effect (meaning this test can be used) for the duration of the COVID-19 declaration under Section 564(b)(1) of the Act, 21 U.S.C.section 360bbb-3(b)(1), unless the authorization is terminated  or revoked sooner.       Influenza A by PCR NEGATIVE NEGATIVE Final   Influenza B by PCR NEGATIVE NEGATIVE Final    Comment: (NOTE) The Xpert Xpress SARS-CoV-2/FLU/RSV plus assay is intended as an aid in the diagnosis of influenza from Nasopharyngeal swab specimens and should not be used as a sole basis for treatment.  Nasal washings and aspirates are unacceptable for Xpert Xpress SARS-CoV-2/FLU/RSV testing.  Fact Sheet for Patients: BloggerCourse.com  Fact Sheet for Healthcare Providers: SeriousBroker.it  This test is not yet approved or cleared by the [place] FDA and has been authorized for detection and/or diagnosis of SARS-CoV-2 by FDA under an Emergency Use Authorization (EUA). This  EUA will remain in effect (meaning this test can be used) for the duration of the COVID-19 declaration under Section 564(b)(1) of the Act, 21 U.S.C. section 360bbb-3(b)(1), unless the authorization is terminated or revoked.  Performed at [facility], [address] [place], [place] [address]        Today   Subjective:   Tarren Mohiuddin today has reports headache significantly subsided, only minor episode this morning, no further nausea, no further vomiting.    Objective:   Blood pressure (!) 156/92, pulse (!) 52, temperature 98.8 F (37.1 C), temperature source Axillary, resp. rate 16, height  (1.626 m), weight 100.3 kg, SpO2 97 %.   Intake/Output Summary (Last 24 hours) at 09/12/2020 1128 Last data filed at 09/12/2020 0851 Gross per 24 hour  Intake 1949.03 ml  Output --  Net 1949.03 ml    Exam Awake Alert, Oriented x 3, No new F.N deficits, Normal affect Symmetrical Chest wall movement, Good air movement bilaterally, CTAB RRR,No Gallops,Rubs or new Murmurs, No Parasternal Heave +ve B.Sounds, Abd Soft, Non tender, No rebound -guarding or rigidity. No Cyanosis, Clubbing or edema, No new Rash or bruise  Data Review   CBC w Diff:  Lab Results  Component Value Date   WBC 5.8 09/12/2020   HGB 10.7 (L) 09/12/2020   HCT 32.5 (L) 09/12/2020   PLT 249 09/12/2020   LYMPHOPCT 38 09/09/2020   MONOPCT 6 09/09/2020   EOSPCT 2 09/09/2020   BASOPCT 1 09/09/2020    CMP:  Lab Results  Component Value Date   NA 139 09/12/2020   K 3.9 09/12/2020   CL 107 09/12/2020   CO2 27 09/12/2020   BUN 15 09/12/2020   CREATININE 1.23 (H) 09/12/2020   PROT 6.4 (L) 09/11/2020   ALBUMIN 3.2 (L) 09/11/2020   BILITOT 0.7 09/11/2020   ALKPHOS 45 09/11/2020   AST 16 09/11/2020   ALT 20 09/11/2020  .   Total Time in preparing paper work, data evaluation and todays exam - 35 minutes  Huey Bienenstock M.D on 09/12/2020 at 11:28 AM  [facility]   Office   [phone]

## 2020-09-12 NOTE — Plan of Care (Signed)

## 2020-09-12 NOTE — Discharge Instructions (Signed)
Follow with Primary MD Bruna Potter, Viviana Simpler, MD (Inactive) in 7 days   Get CBC, CMP,  checked  by Primary MD next visit.    Activity: As tolerated with Full fall precautions use walker/cane & assistance as needed   Disposition Home    Diet: Heart Healthy , with feeding assistance and aspiration precautions.   On your next visit with your primary care physician please Get Medicines reviewed and adjusted.   Please request your Prim.MD to go over all Hospital Tests and Procedure/Radiological results at the follow up, please get all Hospital records sent to your Prim MD by signing hospital release before you go home.   If you experience worsening of your admission symptoms, develop shortness of breath, life threatening emergency, suicidal or homicidal thoughts you must seek medical attention immediately by calling 911 or calling your MD immediately  if symptoms less severe.  You Must read complete instructions/literature along with all the possible adverse reactions/side effects for all the Medicines you take and that have been prescribed to you. Take any new Medicines after you have completely understood and accpet all the possible adverse reactions/side effects.   Do not drive, operating heavy machinery, perform activities at heights, swimming or participation in water activities or provide baby sitting services if your were admitted for syncope or siezures until you have seen by Primary MD or a Neurologist and advised to do so again.  Do not drive when taking Pain medications.    Do not take more than prescribed Pain, Sleep and Anxiety Medications  Special Instructions: If you have smoked or chewed Tobacco  in the last 2 yrs please stop smoking, stop any regular Alcohol  and or any Recreational drug use.  Wear Seat belts while driving.   Please note  You were cared for by a hospitalist during your hospital stay. If you have any questions about your discharge medications or the  care you received while you were in the hospital after you are discharged, you can call the unit and asked to speak with the hospitalist on call if the hospitalist that took care of you is not available. Once you are discharged, your primary care physician will handle any further medical issues. Please note that NO REFILLS for any discharge medications will be authorized once you are discharged, as it is imperative that you return to your primary care physician (or establish a relationship with a primary care physician if you do not have one) for your aftercare needs so that they can reassess your need for medications and monitor your lab values.

## 2020-09-15 ENCOUNTER — Inpatient Hospital Stay (INDEPENDENT_AMBULATORY_CARE_PROVIDER_SITE_OTHER): Payer: BC Managed Care – PPO | Admitting: Primary Care

## 2020-09-29 ENCOUNTER — Ambulatory Visit: Payer: BC Managed Care – PPO | Admitting: Physician Assistant

## 2021-06-17 ENCOUNTER — Emergency Department (HOSPITAL_COMMUNITY): Payer: BC Managed Care – PPO

## 2021-06-17 ENCOUNTER — Emergency Department (HOSPITAL_COMMUNITY)
Admission: EM | Admit: 2021-06-17 | Discharge: 2021-06-17 | Disposition: A | Discharge status: Home / Self Care | Payer: BC Managed Care – PPO | Attending: Emergency Medicine | Admitting: Emergency Medicine

## 2021-06-17 ENCOUNTER — Encounter (HOSPITAL_COMMUNITY): Payer: Self-pay | Admitting: Emergency Medicine

## 2021-06-17 DIAGNOSIS — I10 Essential (primary) hypertension: Secondary | ICD-10-CM | POA: Diagnosis not present

## 2021-06-17 DIAGNOSIS — R0789 Other chest pain: Secondary | ICD-10-CM | POA: Diagnosis present

## 2021-06-17 DIAGNOSIS — Z79899 Other long term (current) drug therapy: Secondary | ICD-10-CM | POA: Diagnosis not present

## 2021-06-17 DIAGNOSIS — I158 Other secondary hypertension: Secondary | ICD-10-CM | POA: Insufficient documentation

## 2021-06-17 DIAGNOSIS — N9489 Other specified conditions associated with female genital organs and menstrual cycle: Secondary | ICD-10-CM | POA: Diagnosis not present

## 2021-06-17 DIAGNOSIS — I159 Secondary hypertension, unspecified: Secondary | ICD-10-CM

## 2021-06-17 DIAGNOSIS — R079 Chest pain, unspecified: Secondary | ICD-10-CM

## 2021-06-17 LAB — CBC
HCT: 38.5 % (ref 36.0–46.0)
Hemoglobin: 12.5 g/dL (ref 12.0–15.0)
MCH: 28.4 pg (ref 26.0–34.0)
MCHC: 32.5 g/dL (ref 30.0–36.0)
MCV: 87.5 fL (ref 80.0–100.0)
Platelets: 294 10*3/uL (ref 150–400)
RBC: 4.4 MIL/uL (ref 3.87–5.11)
RDW: 13.6 % (ref 11.5–15.5)
WBC: 4.6 10*3/uL (ref 4.0–10.5)
nRBC: 0 % (ref 0.0–0.2)

## 2021-06-17 LAB — BASIC METABOLIC PANEL
Anion gap: 10 (ref 5–15)
BUN: 11 mg/dL (ref 6–20)
CO2: 26 mmol/L (ref 22–32)
Calcium: 9.2 mg/dL (ref 8.9–10.3)
Chloride: 101 mmol/L (ref 98–111)
Creatinine, Ser: 1.01 mg/dL — ABNORMAL HIGH (ref 0.44–1.00)
GFR, Estimated: >60 mL/min (ref >60)
Glucose, Bld: 99 mg/dL (ref 70–99)
Potassium: 3.8 mmol/L (ref 3.5–5.1)
Sodium: 137 mmol/L (ref 135–145)

## 2021-06-17 LAB — I-STAT BETA HCG BLOOD, ED (MC, WL, AP ONLY): I-stat hCG, quantitative: <5 m[IU]/mL (ref <5)

## 2021-06-17 LAB — TROPONIN I (HIGH SENSITIVITY)
Troponin I (High Sensitivity): 5 ng/L (ref <18)
Troponin I (High Sensitivity): 5 ng/L (ref <18)

## 2021-06-17 MED ORDER — VERAPAMIL HCL ER 240 MG PO TBCR
240.0000 mg | EXTENDED_RELEASE_TABLET | Freq: Every day | ORAL | Status: DC
  Administered 2021-06-17: 240 mg via ORAL
  Filled 2021-06-17: qty 1

## 2021-06-17 MED ORDER — IOHEXOL 350 MG/ML SOLN
80.0000 mL | Freq: Once | INTRAVENOUS | Status: AC | PRN
  Administered 2021-06-17: 80 mL via INTRAVENOUS

## 2021-06-17 MED ORDER — TRIAMTERENE-HCTZ 37.5-25 MG PO TABS
0.5000 | ORAL_TABLET | Freq: Every day | ORAL | Status: DC
  Administered 2021-06-17: 0.5 via ORAL
  Filled 2021-06-17: qty 1

## 2021-06-17 MED ORDER — LABETALOL HCL 200 MG PO TABS
100.0000 mg | ORAL_TABLET | Freq: Two times a day (BID) | ORAL | Status: DC
  Administered 2021-06-17: 100 mg via ORAL
  Filled 2021-06-17: qty 1

## 2021-06-17 MED ORDER — HYDRALAZINE HCL 25 MG PO TABS: 25.0000 mg | ORAL_TABLET | Freq: Three times a day (TID) | ORAL | 0 refills | Status: AC

## 2021-06-17 MED ORDER — ATORVASTATIN CALCIUM 10 MG PO TABS
20.0000 mg | ORAL_TABLET | Freq: Every day | ORAL | Status: DC
  Administered 2021-06-17: 20 mg via ORAL

## 2021-06-17 MED ORDER — LABETALOL HCL 100 MG PO TABS: 50.0000 mg | ORAL_TABLET | Freq: Two times a day (BID) | ORAL | 0 refills | Status: AC

## 2021-06-17 NOTE — Discharge Instructions (Addendum)
Please call the number above to schedule follow-up appointment with a cardiothoracic surgeon for your aorta.  You can discuss further management options with the specialist.  I spoke to Dr. Clelia Croft on the phone, his office will try to arrange for follow-up care for you.  They are aware of your blood pressure condition.  We agreed on some changes to your blood pressure medications at home.  First, you will reduce your labetalol dose to 50 mg twice a day (1/2 tablet), once in the morning and once at night.  Second, you will start back on hydralazine, 25 mg 3 times a day (morning, afternoon, bedtime).  Continue taking your normal dose of Verapamil and Maxzide medications.  *  Please check your blood pressure twice a day as we discussed, first when you wake up and before bedtime.  Keep a daily diary of your blood pressures.

## 2021-06-17 NOTE — ED Triage Notes (Signed)
Pt. Stated, Im having chest pain since this weekend and my BP was up since weekend

## 2021-06-17 NOTE — ED Provider Notes (Signed)
[facility]PART[facility] Provider Note   CSN: [id number] Arrival date & time: 06/17/21  [id number]     History  Chief Complaint  Patient presents with   Hypertension   Weakness   Chest Pain    Debbie Davidson is a 51 y.o. female with history hypertension, hypertensive crisis, presented ED with chest pressure and paresthesias in her fingers.  Per medical record review the patient was hospitalized for hypertensive emergency in April of last year.  She was discharged on oral blood pressure medications, has not seen cardiology and had his medication changed, though she cannot recall her current regimen.  She has not taken her morning meds today because he typically takes them at work.  She reports she was on her way to work was having pressure on the left side of her chest and paresthesias in her bilateral fingers.  She had this is similar to her admission last year.  She does have a history of a thoracic aneurysm measuring 4.4 cm on CT angio performed in March of last year.  She says she sees Dr. Mardene Speak at [facility] (cardiology), and states that she had an office ultrasound a few months ago and was told that the aneurysm was "larger".  They are in ongoing discussions about possible operative interventions.    HPI     Home Medications Prior to Admission medications   Medication Sig Start Date End Date Taking? Authorizing Provider  atorvastatin (LIPITOR) 20 MG tablet Take 1 tablet (20 mg total) by mouth daily. 09/03/20  Yes Shawna Clamp, MD  cetirizine (ZYRTEC) 10 MG tablet Take 10 mg by mouth daily as needed for allergies.   Yes [provider]  Multiple Vitamin (MULTIVITAMIN) tablet Take 1 tablet by mouth daily.   Yes [provider]  omeprazole (PRILOSEC) 40 MG capsule Take 1 capsule (40 mg total) by mouth daily. 08/30/20 06/17/21 Yes Carmon Sails J, PA-C  ondansetron (ZOFRAN ODT) 4 MG disintegrating tablet Take 1 tablet (4 mg  total) by mouth every 8 (eight) hours as needed for nausea or vomiting. 08/30/20  Yes Kinnie Feil, PA-C  triamterene-hydrochlorothiazide (MAXZIDE-25) 37.5-25 MG tablet Take 0.5 tablets by mouth daily.   Yes [provider]  verapamil (CALAN-SR) 240 MG CR tablet Take 240 mg by mouth daily.   Yes [provider]  hydrALAZINE (APRESOLINE) 25 MG tablet Take 1 tablet (25 mg total) by mouth 3 (three) times daily. 06/17/21 07/17/21  Wyvonnia Dusky, MD  labetalol (NORMODYNE) 100 MG tablet Take 0.5 tablets (50 mg total) by mouth 2 (two) times daily. 06/17/21 07/17/21  Wyvonnia Dusky, MD  sucralfate (CARAFATE) 1 GM/10ML suspension Take 10 mLs (1 g total) by mouth 4 (four) times daily -  with meals and at bedtime. Patient not taking: Reported on 06/17/2021 08/30/20   Kinnie Feil, PA-C      Allergies    Oxycodone and Penicillins    Review of Systems   Review of Systems  Constitutional:  Negative for chills and fever.  HENT:  Negative for ear pain and sore throat.   Eyes:  Negative for pain and visual disturbance.  Respiratory:  Negative for cough and shortness of breath.   Cardiovascular:  Positive for chest pain. Negative for palpitations.  Gastrointestinal:  Negative for abdominal pain and vomiting.  Genitourinary:  Negative for dysuria and hematuria.  Musculoskeletal:  Negative for arthralgias and back pain.  Skin:  Negative for color change and rash.  Neurological:  Positive for numbness. Negative for syncope and weakness.  All other systems reviewed and are negative.  Physical Exam Updated Vital Signs BP 120/75    Pulse 61    Temp 97.9 F (36.6 C)    Resp 19    LMP 06/14/2021    SpO2 98%  Physical Exam Constitutional:      General: She is not in acute distress. HENT:     Head: Normocephalic and atraumatic.  Eyes:     Conjunctiva/sclera: Conjunctivae normal.     Pupils: Pupils are equal, round, and reactive to light.  Cardiovascular:     Rate and Rhythm:  Normal rate and regular rhythm.  Pulmonary:     Effort: Pulmonary effort is normal. No respiratory distress.  Abdominal:     General: There is no distension.     Tenderness: There is no abdominal tenderness.  Skin:    General: Skin is warm and dry.  Neurological:     General: No focal deficit present.     Mental Status: She is alert and oriented to person, place, and time. Mental status is at baseline.     Cranial Nerves: No cranial nerve deficit.     Motor: No weakness.  Psychiatric:        Mood and Affect: Mood normal.        Behavior: Behavior normal.    ED Results / Procedures / Treatments   Labs (all labs ordered are listed, but only abnormal results are displayed) Labs Reviewed  BASIC METABOLIC PANEL - Abnormal; Notable for the following components:      Result Value   Creatinine, Ser 1.01 (*)    All other components within normal limits  CBC  I-STAT BETA HCG BLOOD, ED ([facility], [facility], [facility] ONLY)  TROPONIN I (HIGH SENSITIVITY)  TROPONIN I (HIGH SENSITIVITY)    EKG EKG Interpretation  Date/Time:  Thursday June 17 2021 09:52:56 EST Ventricular Rate:  59 PR Interval:  217 QRS Duration: 91 QT Interval:  443 QTC Calculation: 439 R Axis:   21 Text Interpretation: Sinus arrhythmia Prolonged PR interval Borderline T wave abnormalities Confirmed by Octaviano Glow [phone]) on 06/17/2021 12:45:25 PM  Radiology DG Chest 2 View  Result Date: 06/17/2021 CLINICAL DATA:  Chest pain for the past several days. EXAM: CHEST - 2 VIEW COMPARISON:  09/09/2020 FINDINGS: The heart is borderline enlarged but appears stable. Mild tortuosity of the thoracic aorta. The lungs are clear of an acute process. No pleural effusions or pulmonary lesions. The bony thorax is intact. IMPRESSION: No acute cardiopulmonary findings. Electronically Signed   By: Marijo Sanes M.D.   On: 06/17/2021 09:22   CT Angio Chest Aorta W and/or Wo Contrast  Result Date: 06/17/2021 CLINICAL DATA:  Preop planning for  thoracic aortic aneurysm. EXAM: CT ANGIOGRAPHY CHEST WITH CONTRAST TECHNIQUE: Multidetector CT imaging of the chest was performed using the standard protocol during bolus administration of intravenous contrast. Multiplanar CT image reconstructions and MIPs were obtained to evaluate the vascular anatomy. RADIATION DOSE REDUCTION: This exam was performed according to the departmental dose-optimization program which includes automated exposure control, adjustment of the mA and/or kV according to patient size and/or use of iterative reconstruction technique. CONTRAST:  102mL OMNIPAQUE IOHEXOL 350 MG/ML SOLN COMPARISON:  Plain film of 1 day prior.  CTA chest 08/30/2020 FINDINGS: Cardiovascular:  Aortic atherosclerosis. Ascending aortic aneurysm. When measured at the same level in a similar fashion to on the prior exam, 4.2 cm in the mid ascending segment  on 59/5 today versus 4.4 cm on the prior. Based on coronal reformats, maximally 4.1 cm today, similar. No dilatation of the sinuses or sinotubular junction. Normal caliber great vessels, transverse and descending thoracic aorta. Tortuous thoracic aorta. Mild cardiomegaly. No central pulmonary embolism, on this non-dedicated study. Mediastinum/Nodes: No mediastinal or hilar adenopathy. Subtle fluid level in the esophagus on 55/5. Lungs/Pleura: No pleural fluid.  Clear lungs. Upper Abdomen: Normal imaged portions of the liver, spleen, stomach, pancreas, adrenal glands, kidneys. Musculoskeletal: No acute osseous abnormality. Review of the MIP images confirms the above findings. IMPRESSION: 1. Similar ascending aortic aneurysm, mild at on the order of 4.1-4.2 cm. Recommend annual imaging followup by CTA or MRA. This recommendation follows 2010 ACCF/AHA/AATS/ACR/ASA/SCA/SCAI/SIR/STS/SVM Guidelines for the Diagnosis and Management of Patients with Thoracic Aortic Disease. Circulation. 2010; [id number]. Aortic aneurysm NOS (ICD10-I71.9) 2.  Aortic Atherosclerosis  (ICD10-I70.0). 3. Esophageal air fluid level suggests dysmotility or gastroesophageal reflux. Electronically Signed   By: Abigail Miyamoto M.D.   On: 06/17/2021 11:19    Procedures Procedures    Medications Ordered in ED Medications  atorvastatin (LIPITOR) tablet 20 mg (20 mg Oral Given 06/17/21 1141)  labetalol (NORMODYNE) tablet 100 mg (100 mg Oral Given 06/17/21 1141)  triamterene-hydrochlorothiazide (MAXZIDE-25) 37.5-25 MG per tablet 0.5 tablet (0.5 tablets Oral Given 06/17/21 1140)  verapamil (CALAN-SR) CR tablet 240 mg (240 mg Oral Given 06/17/21 1140)  iohexol (OMNIPAQUE) 350 MG/ML injection 80 mL (80 mLs Intravenous Contrast Given 06/17/21 1111)    ED Course/ Medical Decision Making/ A&P Clinical Course as of 06/17/21 1311  Thu Jun 17, 2021  1019 Troponin 5, unremarkable, creatinine 1.0 at baseline. [MT]  1023 Pharmacy to perform stat med rec [MT]  [place] rec completed, BP meds ordered as confirmed by pharmacist and patient [MT]  1123 IMPRESSION: 1. Similar ascending aortic aneurysm, mild at on the order of 4.1-4.2 cm. Recommend annual imaging followup by CTA or MRA. This recommendation follows 2010 ACCF/AHA/AATS/ACR/ASA/SCA/SCAI/SIR/STS/SVM Guidelines for the Diagnosis and Management of Patients with Thoracic Aortic Disease. Circulation. 2010; [id number]. Aortic aneurysm NOS (ICD10-I71.9) 2.  Aortic Atherosclerosis (ICD10-I70.0). 3. Esophageal air fluid level suggests dysmotility or gastroesophageal reflux. [MT]  1209 I had extensive discussion with the patient regarding my phone call Dr. Brigitte Pulse, follow-up recommendations and blood pressure control.  She confirmed that she has not been taking the hydralazine, although Dr. Brigitte Pulse thought she was on this.  Therefore we will restart her on 25 mg 3 times a day of the hydralazine, with a as needed dose as needed for hypertension.  We will reduce her labetalol dosing to 50 mg twice a day.  She will follow-up with Dr. Brigitte Pulse this week in  the office. [MT]  1245 EKG per my interpretation shows normal sinus rhythm without acute ischemic findings [MT]    Clinical Course User Index [MT] Waynetta Metheny, Carola Rhine, MD                           Medical Decision Making  This patient presents to the ED with concern for hypertension, chest pressure, paresthesias.  This involves an extensive number of treatment options, and is a complaint that carries with it a high risk of complications and morbidity.  The differential diagnosis includes atypical ACS versus hypertensive emergency versus electrolyte derangement versus other intrathoracic process, versus aortic pathology.  Co-morbidities that complicate the patient evaluation: History of thoracic aneurysm  External records from outside source obtained and reviewed  including CT angiogram performed last year, hospital course and discharge summary from April of last year.  Per review of her hospital course, the patient had been taken off of Coreg due to bradycardia.  She was discharged on hydralazine at that time 3 times daily, but she is not certain that she is taking hydralazine anymore, and thinks she may be on atenolol.  I will ask for a stat pharmacy rec for her medications, then get her morning medications that she has not taken so far.  No headache or stroke symptoms on arrival to suggest CVA  Additional history and care discussed with cardiologist Dr Mardene Speak at [facility] by phone, who advised that we reduce her labetalol to 50 mg twice a day, that her office will arrange for follow-up this week with the patient, and that we provide her with a CT surgeon for general office evaluation.  He confirms that he did a bedside echocardiogram and measured the aortic root, with a possible measurement up to 4.5 cm, although he feels CT image would be more accurate in terms of measurements.  I ordered and personally interpreted labs.  The pertinent results include:  baseline Cr, unremarkable  trop.  Repeat trop pending.  I ordered imaging studies including x-ray of the chest, CTA chest I independently visualized and interpreted imaging which showed no acute intrathoracic pathology, no significantly widened mediastinum on x-ray; CTA showed stable AAA measurements, no dissection, no large PE, PTX, PNA I agree with the radiologist interpretation  The patient was maintained on a cardiac monitor.  I personally viewed and interpreted the cardiac monitored which showed an underlying rhythm of: Sinus rhythm  Per my interpretation the patient's ECG shows sinus rhythm without significant changes from prior EKG  I ordered medication including home blood pressure medications for hypertension I have reviewed the patients home medicines and have made adjustments as needed  Test Considered:  -Unlikely PE with no respiratory symptoms, no tachypnea, no acute risk factors for PE.  No signs or symptoms of stroke to warrant emergent imaging of the brain   After the interventions noted above, I reevaluated the patient and found that they have: improved   Dispostion:  After consideration of the diagnostic results and the patients response to treatment, I feel that the patent would benefit from close outpatient cardiology follow up.  I explained it's possible that her symptoms are related to labetalol beta blocker, and she needs to discuss this with Dr Raul Del office.  I spoke to Dr Mardene Speak at [place] [facility] by phone, who advised that we reduce her labetalol to 50 mg twice a day, that her office will arrange for follow-up this week with the patient, and that we provide her with a CT surgeon for general office evaluation.  He confirms that he did a bedside echocardiogram and measured the aortic root, with a possible measurement up to 4.5 cm, although he feels CT image would be more accurate in terms of measurements.  This plan was discussed with the patient who agrees         Final  Clinical Impression(s) / ED Diagnoses Final diagnoses:  Chest pain, unspecified type  Secondary hypertension    Rx / DC Orders ED Discharge Orders          Ordered    hydrALAZINE (APRESOLINE) 25 MG tablet  3 times daily        06/17/21 1213    labetalol (NORMODYNE) 100 MG tablet  2 times  daily        06/17/21 1214              Wyvonnia Dusky, MD 06/17/21 1311

## 2023-08-11 ENCOUNTER — Other Ambulatory Visit (HOSPITAL_COMMUNITY): Payer: Self-pay | Admitting: *Deleted

## 2023-08-11 DIAGNOSIS — R0609 Other forms of dyspnea: Secondary | ICD-10-CM

## 2024-01-09 IMAGING — CR DG CHEST 2V
2 series · 2 of 2 positions shown · non-contrast
Comparison: 09/09/2020

CLINICAL DATA: Chest pain for the past several days.

EXAM:
CHEST - 2 VIEW

[chest pa]
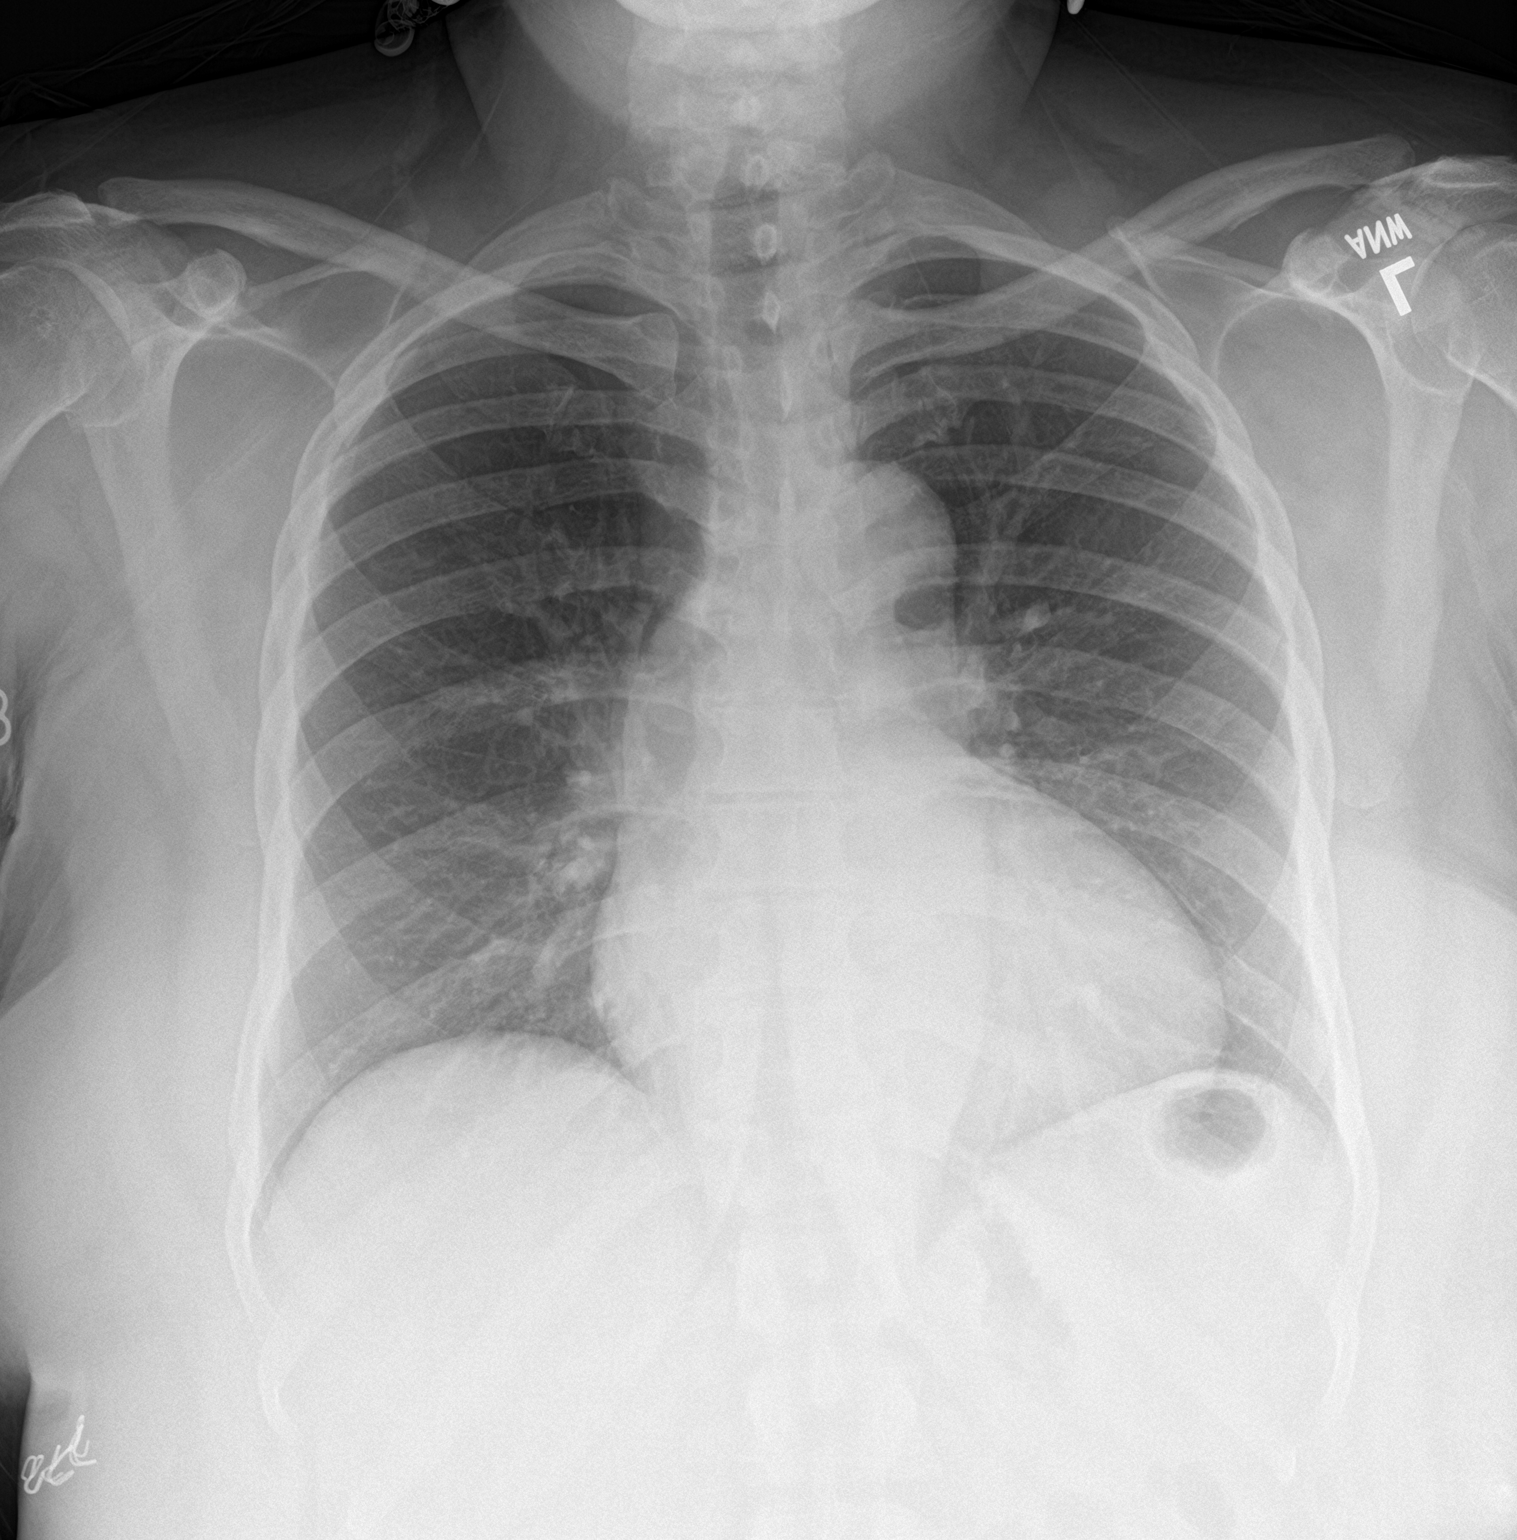

[chest lat]
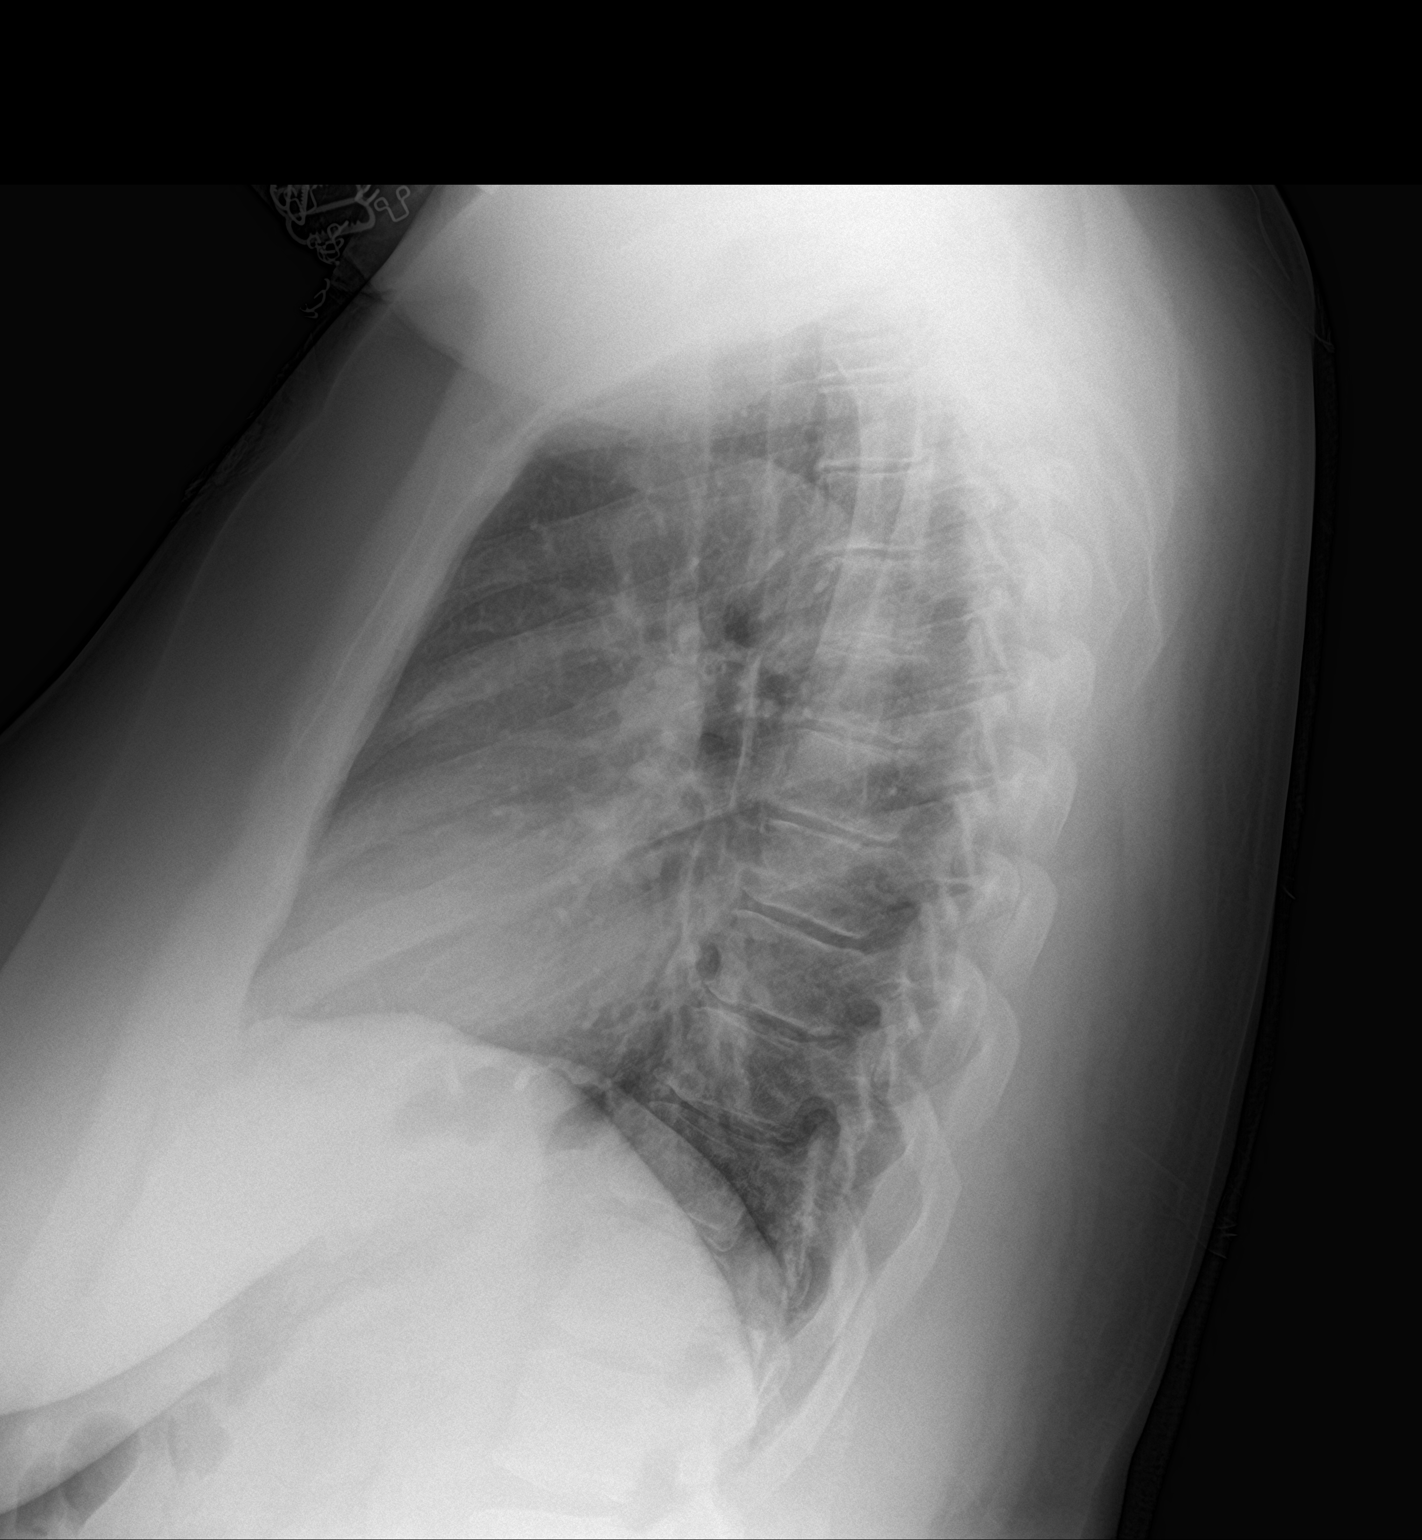

[2 of 2 positions shown; findings below may reference images not displayed]

FINDINGS: The heart is borderline enlarged but appears stable. Mild tortuosity
of the thoracic aorta. The lungs are clear of an acute process. No
pleural effusions or pulmonary lesions. The bony thorax is intact.
IMPRESSION: No acute cardiopulmonary findings.
